# Patient Record
Sex: Male | Born: 1955 | Race: White | Hispanic: No | Smoking: Current every day smoker
Health system: Southern US, Community
[De-identification: ages and names within clinical notes are randomized; demographics above are authoritative.]

## PROBLEM LIST (undated history)

## (undated) DIAGNOSIS — M25552 Pain in left hip: Secondary | ICD-10-CM

## (undated) DIAGNOSIS — Z87442 Personal history of urinary calculi: Secondary | ICD-10-CM

## (undated) DIAGNOSIS — B880 Other acariasis: Secondary | ICD-10-CM

## (undated) DIAGNOSIS — T17308A Unspecified foreign body in larynx causing other injury, initial encounter: Secondary | ICD-10-CM

## (undated) DIAGNOSIS — J101 Influenza due to other identified influenza virus with other respiratory manifestations: Secondary | ICD-10-CM

## (undated) DIAGNOSIS — K4020 Bilateral inguinal hernia, without obstruction or gangrene, not specified as recurrent: Secondary | ICD-10-CM

## (undated) DIAGNOSIS — H6692 Otitis media, unspecified, left ear: Secondary | ICD-10-CM

## (undated) HISTORY — PX: KNEE ARTHROSCOPY: SHX127

---

## 2012-08-29 ENCOUNTER — Encounter (HOSPITAL_BASED_OUTPATIENT_CLINIC_OR_DEPARTMENT_OTHER): Payer: Self-pay | Admitting: *Deleted

## 2012-08-29 ENCOUNTER — Emergency Department (HOSPITAL_BASED_OUTPATIENT_CLINIC_OR_DEPARTMENT_OTHER)
Admission: EM | Admit: 2012-08-29 | Discharge: 2012-08-30 | Disposition: A | Discharge status: Home / Self Care | Payer: 59 | Attending: Emergency Medicine | Admitting: Emergency Medicine

## 2012-08-29 DIAGNOSIS — F17200 Nicotine dependence, unspecified, uncomplicated: Secondary | ICD-10-CM | POA: Insufficient documentation

## 2012-08-29 DIAGNOSIS — L089 Local infection of the skin and subcutaneous tissue, unspecified: Secondary | ICD-10-CM

## 2012-08-29 DIAGNOSIS — IMO0001 Reserved for inherently not codable concepts without codable children: Secondary | ICD-10-CM | POA: Insufficient documentation

## 2012-08-29 NOTE — ED Notes (Signed)
Pt c/o insect bite to lower ext x 1 day

## 2012-08-30 ENCOUNTER — Encounter (HOSPITAL_BASED_OUTPATIENT_CLINIC_OR_DEPARTMENT_OTHER): Payer: Self-pay | Admitting: Emergency Medicine

## 2012-08-30 MED ORDER — PREDNISONE 20 MG PO TABS: 40.0000 mg | ORAL_TABLET | Freq: Every day | ORAL | Status: AC

## 2012-08-30 MED ORDER — PREDNISONE 20 MG PO TABS
40.0000 mg | ORAL_TABLET | Freq: Once | ORAL | Status: AC
  Administered 2012-08-30: 40 mg via ORAL
  Filled 2012-08-30: qty 2

## 2012-08-30 MED ORDER — DIPHENHYDRAMINE HCL 25 MG PO CAPS
50.0000 mg | ORAL_CAPSULE | Freq: Once | ORAL | Status: AC
  Administered 2012-08-30: 50 mg via ORAL
  Filled 2012-08-30: qty 2

## 2012-08-30 NOTE — ED Notes (Signed)
MD at bedside. 

## 2012-08-30 NOTE — ED Provider Notes (Signed)
History     CSN: [id number]  Arrival date & time 08/29/12  2150   First MD Initiated Contact with Patient 08/30/12 0101      Chief Complaint  Patient presents with  . Insect Bite    (Consider location/radiation/quality/duration/timing/severity/associated sxs/prior treatment) HPI Comments: Pt reports going hunting earlier today and was bitten by multiple chiggers along both lower legs, into thighs and around groin.  Severe itching.  No drainage from wounds, no fevers, chills.  Denies SOB.  Pt was treated for similar in the past with steroids, requests same.  Pt reports has benadryl at home.  Has not taken anything for symptoms yet.    The history is provided by the patient and a relative.    History reviewed. No pertinent past medical history.  History reviewed. No pertinent past surgical history.  History reviewed. No pertinent family history.  History  Substance Use Topics  . Smoking status: Current Everyday Smoker -- 2.0 packs/day  . Smokeless tobacco: Not on file  . Alcohol Use: No      Review of Systems  Constitutional: Negative.   HENT: Negative for trouble swallowing.   Respiratory: Negative for shortness of breath.   Gastrointestinal: Negative for nausea and diarrhea.  Skin: Positive for rash.  Neurological: Negative for dizziness and light-headedness.    Allergies  Review of patient's allergies indicates no known allergies.  Home Medications   Current Outpatient Rx  Name Route Sig Dispense Refill  . PREDNISONE 20 MG PO TABS Oral Take 2 tablets (40 mg total) by mouth daily. 14 tablet 0    BP 110/68  Pulse 75  Temp 98.2 F (36.8 C)  Resp 16  Ht 5\' 9"  (1.753 m)  Wt 130 lb (58.968 kg)  BMI 19.20 kg/m2  SpO2 100%  Physical Exam  Nursing note and vitals reviewed. Constitutional: He is oriented to person, place, and time. He appears well-developed and well-nourished.  HENT:  Head: Normocephalic and atraumatic.  Pulmonary/Chest: Effort normal and  breath sounds normal. No stridor. No respiratory distress.  Neurological: He is alert and oriented to person, place, and time.  Skin: Skin is warm and dry. Rash noted. He is not diaphoretic. No pallor.       Multiple pruritic small papular lesions to lower legs, upper legs, more concentrated around ankles and lower legs.  Psychiatric: He has a normal mood and affect.    ED Course  Procedures (including critical care time)  Labs Reviewed - No data to display No results found.   1. Infected insect bites of multiple sites       MDM  Multiple insect bites, sig portion of body, will treat with systemic steroids, pt has used before.  Benadryl for itching.          Gavin Pound. Oletta Lamas, MD 08/30/12 [phone]

## 2013-03-20 ENCOUNTER — Emergency Department (HOSPITAL_BASED_OUTPATIENT_CLINIC_OR_DEPARTMENT_OTHER): Payer: 59

## 2013-03-20 ENCOUNTER — Encounter (HOSPITAL_BASED_OUTPATIENT_CLINIC_OR_DEPARTMENT_OTHER): Payer: Self-pay | Admitting: *Deleted

## 2013-03-20 ENCOUNTER — Emergency Department (HOSPITAL_BASED_OUTPATIENT_CLINIC_OR_DEPARTMENT_OTHER)
Admission: EM | Admit: 2013-03-20 | Discharge: 2013-03-20 | Disposition: A | Discharge status: Home / Self Care | Payer: 59 | Attending: Emergency Medicine | Admitting: Emergency Medicine

## 2013-03-20 DIAGNOSIS — N200 Calculus of kidney: Secondary | ICD-10-CM | POA: Insufficient documentation

## 2013-03-20 DIAGNOSIS — R110 Nausea: Secondary | ICD-10-CM | POA: Insufficient documentation

## 2013-03-20 DIAGNOSIS — F17200 Nicotine dependence, unspecified, uncomplicated: Secondary | ICD-10-CM | POA: Insufficient documentation

## 2013-03-20 LAB — CBC WITH DIFFERENTIAL/PLATELET
Eosinophils Absolute: 0.1 10*3/uL (ref 0.0–0.7)
Eosinophils Relative: 2 % (ref 0–5)
HCT: 43.1 % (ref 39.0–52.0)
Hemoglobin: 15 g/dL (ref 13.0–17.0)
Lymphs Abs: 2.3 10*3/uL (ref 0.7–4.0)
MCH: 31.3 pg (ref 26.0–34.0)
MCV: 90 fL (ref 78.0–100.0)
Monocytes Absolute: 1 10*3/uL (ref 0.1–1.0)
Monocytes Relative: 12 % (ref 3–12)
Platelets: 285 10*3/uL (ref 150–400)
RBC: 4.79 MIL/uL (ref 4.22–5.81)

## 2013-03-20 LAB — BASIC METABOLIC PANEL
BUN: 13 mg/dL (ref 6–23)
CO2: 25 mEq/L (ref 19–32)
Calcium: 9.1 mg/dL (ref 8.4–10.5)
Creatinine, Ser: 0.9 mg/dL (ref 0.50–1.35)
GFR calc non Af Amer: >90 mL/min (ref >90)
Glucose, Bld: 148 mg/dL — ABNORMAL HIGH (ref 70–99)

## 2013-03-20 LAB — URINALYSIS, ROUTINE W REFLEX MICROSCOPIC
Bilirubin Urine: NEGATIVE
Glucose, UA: NEGATIVE mg/dL
Specific Gravity, Urine: >1.046 — ABNORMAL HIGH (ref 1.005–1.030)
Urobilinogen, UA: 0.2 mg/dL (ref 0.0–1.0)

## 2013-03-20 MED ORDER — ONDANSETRON HCL 4 MG/2ML IJ SOLN
4.0000 mg | Freq: Once | INTRAMUSCULAR | Status: AC
  Administered 2013-03-20: 4 mg via INTRAVENOUS
  Filled 2013-03-20: qty 2

## 2013-03-20 MED ORDER — HYDROMORPHONE HCL PF 1 MG/ML IJ SOLN
1.0000 mg | Freq: Once | INTRAMUSCULAR | Status: AC
  Administered 2013-03-20: 1 mg via INTRAVENOUS
  Filled 2013-03-20: qty 1

## 2013-03-20 MED ORDER — IOHEXOL 300 MG/ML  SOLN
100.0000 mL | Freq: Once | INTRAMUSCULAR | Status: AC | PRN
  Administered 2013-03-20: 100 mL via INTRAVENOUS

## 2013-03-20 MED ORDER — SODIUM CHLORIDE 0.9 % IV BOLUS (SEPSIS)
1000.0000 mL | Freq: Once | INTRAVENOUS | Status: AC
  Administered 2013-03-20: 1000 mL via INTRAVENOUS

## 2013-03-20 MED ORDER — IOHEXOL 300 MG/ML  SOLN
50.0000 mL | Freq: Once | INTRAMUSCULAR | Status: AC | PRN
  Administered 2013-03-20: 50 mL via ORAL

## 2013-03-20 NOTE — ED Provider Notes (Signed)
History     CSN: [id number]  Arrival date & time 03/20/13  0935   First MD Initiated Contact with Patient 03/20/13 [phone]      Chief Complaint  Patient presents with  . Flank Pain    (Consider location/radiation/quality/duration/timing/severity/associated sxs/prior treatment) Patient is a 57 y.o. male presenting with flank pain.  Flank Pain   Pt with remote history of kidney stone reports he was at work this AM around 7am when he began having R flank pain radiating to RLQ and R groin with sharp pain at tip of penis for a few minutes. He has had constant severe aching pain since then, associated with nausea. No fever. No diarrhea. He has urinated several times since then.   Past Medical History  Diagnosis Date  . Kidney stone     History reviewed. No pertinent past surgical history.  No family history on file.  History  Substance Use Topics  . Smoking status: Current Every Day Smoker -- 2.00 packs/day  . Smokeless tobacco: Not on file  . Alcohol Use: No      Review of Systems  Genitourinary: Positive for flank pain.   All other systems reviewed and are negative except as noted in HPI.   Allergies  Other  Home Medications  No current outpatient prescriptions on file.  BP 116/54  Pulse 64  Temp(Src) 98.7 F (37.1 C) (Oral)  Resp 18  SpO2 99%  Physical Exam  Nursing note and vitals reviewed. Constitutional: He is oriented to person, place, and time. He appears well-developed and well-nourished.  HENT:  Head: Normocephalic and atraumatic.  Eyes: EOM are normal. Pupils are equal, round, and reactive to light.  Neck: Normal range of motion. Neck supple.  Cardiovascular: Normal rate, normal heart sounds and intact distal pulses.   Pulmonary/Chest: Effort normal and breath sounds normal.  Abdominal: Bowel sounds are normal. He exhibits no distension. There is tenderness (RLQ). There is guarding. There is no rebound.  Musculoskeletal: Normal range of motion. He  exhibits no edema and no tenderness.  Neurological: He is alert and oriented to person, place, and time. He has normal strength. No cranial nerve deficit or sensory deficit.  Skin: Skin is warm and dry. No rash noted.  Psychiatric: He has a normal mood and affect.    ED Course  Procedures (including critical care time)  Labs Reviewed  URINALYSIS, ROUTINE W REFLEX MICROSCOPIC - Abnormal; Notable for the following:    Specific Gravity, Urine >1.046 (*)    Hgb urine dipstick MODERATE (*)    All other components within normal limits  BASIC METABOLIC PANEL - Abnormal; Notable for the following:    Glucose, Bld 148 (*)    All other components within normal limits  CBC WITH DIFFERENTIAL  URINE MICROSCOPIC-ADD ON   Ct Abdomen Pelvis W Contrast  03/20/2013  *RADIOLOGY REPORT*  Clinical Data: Right flank pain radiating to right lower quadrant.  CT ABDOMEN AND PELVIS WITH CONTRAST  Technique:  Multidetector CT imaging of the abdomen and pelvis was performed following the standard protocol during bolus administration of intravenous contrast.  Contrast: 50mL OMNIPAQUE IOHEXOL 300 MG/ML  SOLN, OMNIPAQUE IOHEXOL 300 MG/ML  SOLN  Comparison: None.  Findings: 5 mm subpleural nodule in the right middle lobe, image 6 of series 4.  Dependent subsegmental atelectasis noted in the lower lobes. Scattered small fluid density lesions in the liver measure up to 8 mm and are statistically likely represent cysts or benign lesions, but technically nonspecific.  The spleen and gallbladder appear normal.  Dorsal pancreatic duct measures 3 mm in diameter, the dorsal pancreatic duct measures up to 3 mm in diameter, borderline prominent.  A 1.6 x 1.0 cm left adrenal mass has a relative washout of 60%, compatible with adenoma.  A 2 mm right kidney upper pole nonobstructive calculus is present. I suspect a 1 mm punctate right mid kidney calculus.  No hydronephrosis or hydroureter observed.  There is slight indistinctness  along the margins of the right collecting system. Also, a 2 mm calculus is present in the midline in the lumen of the urinary bladder.  No left-sided calculi are identified.  Small retroperitoneal lymph nodes are not pathologically enlarged by size criteria.  Mild aortoiliac atherosclerotic calcification is present.  No pathologic pelvic adenopathy is identified.  A small portion of the cecum extends into the right inguinal hernia.  Appendix observed along the right pelvic sidewall, without inflammation.  No dilated bowel noted.  There is mild prominence of stool in the colon.  No free pelvic fluid is observed.  IMPRESSION:  1.  2 mm calculus loose within the urinary bladder, has likely recently passed from the right ureter given the slight right sided stranding along the collecting system.  No current hydronephrosis or hydroureter. 2.  Nonobstructive right nephrolithiasis. 3.  Small portion of the cecum extends into the right inguinal hernia. 4.  Aortoiliac atherosclerotic calcification. 5.  Scattered hypodensities in the liver, potentially small cysts or bile duct hamartomas.  These are likely benign although technically too small to characterize. 5.  Dorsal pancreatic duct is at the upper limits of normal and sinus.  No pancreas divisum observed. 6.  Small left adrenal adenoma.   Original Report Authenticated By: Gaylyn Rong, M.D.      1. Kidney stone       MDM  Symptoms concerning for kidney stone, but he is guarding in RLQ, so will also check for appendicitis.   12:28 PM Pain improved. CT as above reviewed with the patient including incidental findings. He was advised to strain his urine, although he urinated here after CT was done and may have already passed the stone out of his bladder.      Mate Alegria B. Bernette Mayers, MD 03/20/13 1229

## 2013-03-20 NOTE — ED Notes (Signed)
Patient states he went to urinate this morning at 0700 and developed a sudden pain in his right flank area which is now radiating into groin.  Past hx of kidney stone > 15 years ago. Took otc Goodie powder with no relief.

## 2013-10-16 DIAGNOSIS — Z720 Tobacco use: Secondary | ICD-10-CM | POA: Diagnosis present

## 2018-02-19 DIAGNOSIS — H6692 Otitis media, unspecified, left ear: Secondary | ICD-10-CM

## 2018-02-19 DIAGNOSIS — J101 Influenza due to other identified influenza virus with other respiratory manifestations: Secondary | ICD-10-CM

## 2018-02-19 HISTORY — DX: Influenza due to other identified influenza virus with other respiratory manifestations: J10.1

## 2018-02-19 HISTORY — DX: Otitis media, unspecified, left ear: H66.92

## 2018-06-03 ENCOUNTER — Ambulatory Visit: Payer: Self-pay | Admitting: Surgery

## 2018-06-03 NOTE — H&P (Signed)
Sean Gordon Documented: 06/03/2018 10:30 AM Location: [facility] Patient #: [id number] DOB: [birth year] Widowed / Language: Undefined / Race: Refused to Report/Unreported Male  History of Present Illness Sean Hector MD; 06/03/2018 10:59 AM) The patient is a 62 year old male who presents with an inguinal hernia. Note for "Inguinal hernia": ` ` ` Patient sent for surgical consultation at the request of Dr. Kathie Gordon  Chief Complaint: Testicular groin pain. Right internal hernia.  The patient is a smoking male has had some testicular pain and saw Dr. Karsten Gordon with urology. Some nocturia and hesitancy. Started on outpatient samples. Diagnosised right inguinal hernia. He has a CAT scan from 2014 that showed a moderate size inguinal hernia with some of the cecum going down into it. Recalls being told he had a tear in his groin about 15 years ago. Has not really bothered him until about 9 months ago. No he's had chronic groin pain especially testicular pain. He does smoke about 2 packs a day. Can walk several miles without any difficulty. No history of heart attack or stroke. No abdominal surgery. No prior inguinal surgery. Not diabetic. Moves his bowels every day. Occasionally takes Goody powder for the groin discomfort but not needing narcotics. Takes Xanax to help sleep at night.  (Review of systems as stated in this history (HPI) or in the review of systems. Otherwise all other 12 point ROS are negative) ` ` ` Past medical history as noted on scant sheet. History of kidney stone.  No prior abdominal surgery.  Denies much alcohol use. Smokes 2 packs a day.   Allergies Sean Gordon, [place]; 06/03/2018 10:30 AM) No Known Drug Allergies [06/03/2018]: Allergies Reconciled  Medication History Sean Gordon, [place]; 06/03/2018 10:31 AM) Xanax (0.5MG  Tablet, Oral) Active. Medications Reconciled    Vitals Sean Gordon RMA; 06/03/2018 10:30  AM) 06/03/2018 10:30 AM Weight: 120.2 lb Height: 69in Body Surface Area: 1.66 m Body Mass Index: 17.75 kg/m  Temp.: 97.79F  Pulse: 73 (Regular)  BP: 120/78 (Sitting, Left Arm, Standard)      Physical Exam Sean Hector MD; 06/03/2018 10:56 AM)  General Mental Status-Alert. General Appearance-Not in acute distress, Not Sickly. Orientation-Oriented X3. Hydration-Well hydrated. Voice-Normal.  Integumentary Global Assessment Upon inspection and palpation of skin surfaces of the - Axillae: non-tender, no inflammation or ulceration, no drainage. and Distribution of scalp and body hair is normal. General Characteristics Temperature - normal warmth is noted.  Head and Neck Head-normocephalic, atraumatic with no lesions or palpable masses. Face Global Assessment - atraumatic, no absence of expression. Neck Global Assessment - no abnormal movements, no bruit auscultated on the right, no bruit auscultated on the left, no decreased range of motion, non-tender. Trachea-midline. Thyroid Gland Characteristics - non-tender.  Eye Eyeball - Left-Extraocular movements intact, No Nystagmus. Eyeball - Right-Extraocular movements intact, No Nystagmus. Cornea - Left-No Hazy. Cornea - Right-No Hazy. Sclera/Conjunctiva - Left-No scleral icterus, No Discharge. Sclera/Conjunctiva - Right-No scleral icterus, No Discharge. Pupil - Left-Direct reaction to light normal. Pupil - Right-Direct reaction to light normal.  ENMT Ears Pinna - Left - no drainage observed, no generalized tenderness observed. Right - no drainage observed, no generalized tenderness observed. Nose and Sinuses External Inspection of the Nose - no destructive lesion observed. Inspection of the nares - Left - quiet respiration. Right - quiet respiration. Mouth and Throat Lips - Upper Lip - no fissures observed, no pallor noted. Lower Lip - no fissures observed, no pallor noted.  Nasopharynx - no  discharge present. Oral Cavity/Oropharynx - Tongue - no dryness observed. Oral Mucosa - no cyanosis observed. Hypopharynx - no evidence of airway distress observed.  Chest and Lung Exam Inspection Movements - Normal and Symmetrical. Accessory muscles - No use of accessory muscles in breathing. Palpation Palpation of the chest reveals - Non-tender. Auscultation Breath sounds - Normal and Clear.  Cardiovascular Auscultation Rhythm - Regular. Murmurs & Other Heart Sounds - Auscultation of the heart reveals - No Murmurs and No Systolic Clicks.  Abdomen Inspection Inspection of the abdomen reveals - No Visible peristalsis and No Abnormal pulsations. Umbilicus - No Bleeding, No Urine drainage. Palpation/Percussion Palpation and Percussion of the abdomen reveal - Soft, Non Tender, No Rebound tenderness, No Rigidity (guarding) and No Cutaneous hyperesthesia. Note: Abdomen soft. Nontender. Not distended. No umbilical or incisional hernias. No guarding.  Male Genitourinary Sexual Maturity Tanner 5 - Adult hair pattern and Adult penile size and shape. Note: Obvious right groin bulging reducible. Left bulging with Valsalva. Consistent with bilateral inguinal hernias. No testicular masses but some sensitivity at both testicles. Mildly thickened spermatic cord left greater than right. Question of varicocele. No epididymitis. Phallus within normal limits.  Peripheral Vascular Upper Extremity Inspection - Left - No Cyanotic nailbeds, Not Ischemic. Right - No Cyanotic nailbeds, Not Ischemic.  Neurologic Neurologic evaluation reveals -normal attention span and ability to concentrate, able to name objects and repeat phrases. Appropriate fund of knowledge , normal sensation and normal coordination. Mental Status Affect - not angry, not paranoid. Cranial Nerves-Normal Bilaterally. Gait-Normal.  Neuropsychiatric Mental status exam performed with findings of-able to  articulate well with normal speech/language, rate, volume and coherence, thought content normal with ability to perform basic computations and apply abstract reasoning and no evidence of hallucinations, delusions, obsessions or homicidal/suicidal ideation.  Musculoskeletal Global Assessment Spine, Ribs and Pelvis - no instability, subluxation or laxity. Right Upper Extremity - no instability, subluxation or laxity.  Lymphatic Head & Neck  General Head & Neck Lymphatics: Bilateral - Description - No Localized lymphadenopathy. Axillary  General Axillary Region: Bilateral - Description - No Localized lymphadenopathy. Femoral & Inguinal  Generalized Femoral & Inguinal Lymphatics: Left - Description - No Localized lymphadenopathy. Right - Description - No Localized lymphadenopathy.    Assessment & Plan Sean Hector MD; 06/03/2018 10:55 AM)  BILATERAL INGUINAL HERNIA WITHOUT OBSTRUCTION OR GANGRENE, RECURRENCE NOT SPECIFIED (K40.20) Impression: Obvious right inguinal hernia with bulging on Valsalva had some sliding consistent with small left inguinal hernia as well. Increasing pain and discomfort and testicular discomfort.  I think he would benefit from surgical repair. Laparoscopic preperitoneal approach.  I did caution him that because he's had the hernia for long time with chronic groin pain, he can take several months for that to go down. It may not fully resolved with his smoking and chronic pain. However, I feel we have a good chance to get him to better place. He is interested in proceeding.  He's had some mild nocturia and urinary symptoms. Some relief with Mybertiq from his urologist. I recommend he continue that perioperatively.  I strongly recommended he quit smoking. Makes the likelihood of chronic nerve pain and hernia recurrence much less   PREOP - ING HERNIA - ENCOUNTER FOR PREOPERATIVE EXAMINATION FOR GENERAL SURGICAL PROCEDURE (Z01.818)  Current Plans You are being  scheduled for surgery- Our schedulers will call you.  You should hear from our office's scheduling department within 5 working days about the location, date, and time of surgery. We try to make  accommodations for patient's preferences in scheduling surgery, but sometimes the OR schedule or the surgeon's schedule prevents [place] from making those accommodations.  If you have not heard from our office [phone]) in 5 working days, call the office and ask for your surgeon's nurse.  If you have other questions about your diagnosis, plan, or surgery, call the office and ask for your surgeon's nurse.  Written instructions provided The anatomy & physiology of the abdominal wall and pelvic floor was discussed. The pathophysiology of hernias in the inguinal and pelvic region was discussed. Natural history risks such as progressive enlargement, pain, incarceration, and strangulation was discussed. Contributors to complications such as smoking, obesity, diabetes, prior surgery, etc were discussed.  I feel the risks of no intervention will lead to serious problems that outweigh the operative risks; therefore, I recommended surgery to reduce and repair the hernia. I explained laparoscopic techniques with possible need for an open approach. I noted usual use of mesh to patch and/or buttress hernia repair  Risks such as bleeding, infection, abscess, need for further treatment, heart attack, death, and other risks were discussed. I noted a good likelihood this will help address the problem. Goals of post-operative recovery were discussed as well. Possibility that this will not correct all symptoms was explained. I stressed the importance of low-impact activity, aggressive pain control, avoiding constipation, & not pushing through pain to minimize risk of post-operative chronic pain or injury. Possibility of reherniation was discussed. We will work to minimize complications.  An educational handout  further explaining the pathology & treatment options was given as well. Questions were answered. The patient expresses understanding & wishes to proceed with surgery.  Pt Education - Pamphlet Given - Laparoscopic Hernia Repair: discussed with patient and provided information. Pt Education - [facility] Pain Control (Tannen Vandezande) Pt Education - [facility] Hernia Post-Op HCI (Juliano Mceachin): discussed with patient and provided information. Pt Education - [facility] Mesh education: discussed with patient and provided information.  TOBACCO ABUSE (Z72.0)  Current Plans Pt Education - [facility] STOP SMOKING!  Sean Hector, MD, FACS, MASCRS Gastrointestinal and Minimally Invasive Surgery    [address], [place], [place] [address] [phone] Main / Paging [phone] Fax

## 2018-07-05 ENCOUNTER — Encounter (HOSPITAL_COMMUNITY): Payer: Self-pay

## 2018-07-05 NOTE — Patient Instructions (Addendum)
Your procedure is scheduled on: Friday, July 08, 2018   Surgery Time:  7:30AM-9:30AM   Report to [facility] Main  Entrance    Report to admitting at 5:30 AM   Call this number if you have problems the morning of surgery [phone]   Do not eat food or drink liquids :After Midnight.   Do NOT smoke after Midnight   Complete one Ensure drink the morning of surgery by 4:30AM the day of surgery.   Take these medicines the morning of surgery with A SIP OF WATER: None                               You may not have any metal on your body including jewelry, and body piercings             Do not wear lotions, powders, perfumes/cologne, or deodorant                           Men may shave face and neck.   Do not bring valuables to the hospital. [place].   Contacts, dentures or bridgework may not be worn into surgery.    Patients discharged the day of surgery will not be allowed to drive home.   Special Instructions: Bring a copy of your healthcare power of attorney and living will documents         the day of surgery if you haven't scanned them in before.              Please read over the following fact sheets you were given:  [facility] - Preparing for Surgery Before surgery, you can play an important role.  Because skin is not sterile, your skin needs to be as free of germs as possible.  You can reduce the number of germs on your skin by washing with CHG (chlorahexidine gluconate) soap before surgery.  CHG is an antiseptic cleaner which kills germs and bonds with the skin to continue killing germs even after washing. Please DO NOT use if you have an allergy to CHG or antibacterial soaps.  If your skin becomes reddened/irritated stop using the CHG and inform your nurse when you arrive at Short Stay. Do not shave (including legs and underarms) for at least 48 hours prior to the first CHG shower.  You may shave your  face/neck.  Please follow these instructions carefully:  1.  Shower with CHG Soap the night before surgery and the  morning of surgery.  2.  If you choose to wash your hair, wash your hair first as usual with your normal  shampoo.  3.  After you shampoo, rinse your hair and body thoroughly to remove the shampoo.                             4.  Use CHG as you would any other liquid soap.  You can apply chg directly to the skin and wash.  Gently with a scrungie or clean washcloth.  5.  Apply the CHG Soap to your body ONLY FROM THE NECK DOWN.   Do   not use on face/ open  Wound or open sores. Avoid contact with eyes, ears mouth and   genitals (private parts).                       Wash face,  Genitals (private parts) with your normal soap.             6.  Wash thoroughly, paying special attention to the area where your    surgery  will be performed.  7.  Thoroughly rinse your body with warm water from the neck down.  8.  DO NOT shower/wash with your normal soap after using and rinsing off the CHG Soap.                9.  Pat yourself dry with a clean towel.            10.  Wear clean pajamas.            11.  Place clean sheets on your bed the night of your first shower and do not  sleep with pets. Day of Surgery : Do not apply any lotions/deodorants the morning of surgery.  Please wear clean clothes to the hospital/surgery center.  FAILURE TO FOLLOW THESE INSTRUCTIONS MAY RESULT IN THE CANCELLATION OF YOUR SURGERY  PATIENT SIGNATURE_________________________________  NURSE SIGNATURE__________________________________  ________________________________________________________________________

## 2018-07-06 ENCOUNTER — Encounter (HOSPITAL_COMMUNITY): Payer: Self-pay

## 2018-07-06 ENCOUNTER — Encounter (HOSPITAL_COMMUNITY)
Admission: RE | Admit: 2018-07-06 | Discharge: 2018-07-06 | Disposition: A | Discharge status: Home / Self Care | Payer: 59 | Source: Ambulatory Visit | Attending: Optometrist | Admitting: Optometrist

## 2018-07-06 ENCOUNTER — Other Ambulatory Visit: Payer: Self-pay

## 2018-07-06 DIAGNOSIS — Z79899 Other long term (current) drug therapy: Secondary | ICD-10-CM | POA: Diagnosis not present

## 2018-07-06 DIAGNOSIS — F17200 Nicotine dependence, unspecified, uncomplicated: Secondary | ICD-10-CM | POA: Diagnosis not present

## 2018-07-06 DIAGNOSIS — K4020 Bilateral inguinal hernia, without obstruction or gangrene, not specified as recurrent: Secondary | ICD-10-CM | POA: Diagnosis not present

## 2018-07-06 DIAGNOSIS — D176 Benign lipomatous neoplasm of spermatic cord: Secondary | ICD-10-CM | POA: Diagnosis not present

## 2018-07-06 HISTORY — DX: Bilateral inguinal hernia, without obstruction or gangrene, not specified as recurrent: K40.20

## 2018-07-06 HISTORY — DX: Pain in left hip: M25.552

## 2018-07-06 HISTORY — DX: Personal history of urinary calculi: Z87.442

## 2018-07-06 HISTORY — DX: Other acariasis: B88.0

## 2018-07-06 HISTORY — DX: Unspecified foreign body in larynx causing other injury, initial encounter: T17.308A

## 2018-07-06 LAB — CBC
HCT: 43.8 % (ref 39.0–52.0)
Hemoglobin: 14.9 g/dL (ref 13.0–17.0)
MCH: 31.5 pg (ref 26.0–34.0)
MCHC: 34 g/dL (ref 30.0–36.0)
MCV: 92.6 fL (ref 78.0–100.0)
PLATELETS: 310 10*3/uL (ref 150–400)
RBC: 4.73 MIL/uL (ref 4.22–5.81)
RDW: 13.3 % (ref 11.5–15.5)
WBC: 10.1 10*3/uL (ref 4.0–10.5)

## 2018-07-08 ENCOUNTER — Encounter (HOSPITAL_COMMUNITY)
Admission: RE | Disposition: A | Discharge status: Home / Self Care | Payer: Self-pay | Source: Ambulatory Visit | Attending: Surgery

## 2018-07-08 ENCOUNTER — Encounter (HOSPITAL_COMMUNITY): Payer: Self-pay

## 2018-07-08 ENCOUNTER — Ambulatory Visit (HOSPITAL_COMMUNITY): Payer: 59 | Admitting: Certified Registered Nurse Anesthetist

## 2018-07-08 ENCOUNTER — Ambulatory Visit (HOSPITAL_COMMUNITY)
Admission: RE | Admit: 2018-07-08 | Discharge: 2018-07-08 | Disposition: A | Discharge status: Home / Self Care | Payer: 59 | Source: Ambulatory Visit | Attending: Surgery | Admitting: Surgery

## 2018-07-08 DIAGNOSIS — Z79899 Other long term (current) drug therapy: Secondary | ICD-10-CM | POA: Insufficient documentation

## 2018-07-08 DIAGNOSIS — K4190 Unilateral femoral hernia, without obstruction or gangrene, not specified as recurrent: Secondary | ICD-10-CM

## 2018-07-08 DIAGNOSIS — K4090 Unilateral inguinal hernia, without obstruction or gangrene, not specified as recurrent: Secondary | ICD-10-CM

## 2018-07-08 DIAGNOSIS — Z720 Tobacco use: Secondary | ICD-10-CM | POA: Diagnosis present

## 2018-07-08 DIAGNOSIS — D176 Benign lipomatous neoplasm of spermatic cord: Secondary | ICD-10-CM | POA: Insufficient documentation

## 2018-07-08 DIAGNOSIS — F17200 Nicotine dependence, unspecified, uncomplicated: Secondary | ICD-10-CM | POA: Insufficient documentation

## 2018-07-08 DIAGNOSIS — K4020 Bilateral inguinal hernia, without obstruction or gangrene, not specified as recurrent: Secondary | ICD-10-CM | POA: Diagnosis not present

## 2018-07-08 HISTORY — PX: INSERTION OF MESH: SHX5868

## 2018-07-08 HISTORY — DX: Otitis media, unspecified, left ear: H66.92

## 2018-07-08 HISTORY — PX: INGUINAL HERNIA REPAIR: SHX194

## 2018-07-08 HISTORY — DX: Influenza due to other identified influenza virus with other respiratory manifestations: J10.1

## 2018-07-08 SURGERY — REPAIR, HERNIA, INGUINAL, BILATERAL, LAPAROSCOPIC
Anesthesia: General | Site: Inguinal

## 2018-07-08 MED ORDER — LIDOCAINE 2% (20 MG/ML) 5 ML SYRINGE
INTRAMUSCULAR | Status: DC | PRN
  Administered 2018-07-08: 60 mg via INTRAVENOUS

## 2018-07-08 MED ORDER — ACETAMINOPHEN 650 MG RE SUPP
650.0000 mg | RECTAL | Status: DC | PRN
  Filled 2018-07-08: qty 1

## 2018-07-08 MED ORDER — OXYCODONE HCL 5 MG PO TABS
5.0000 mg | ORAL_TABLET | Freq: Once | ORAL | Status: DC | PRN
Start: 2018-07-08 — End: 2018-07-08

## 2018-07-08 MED ORDER — FENTANYL CITRATE (PF) 100 MCG/2ML IJ SOLN
INTRAMUSCULAR | Status: DC | PRN
  Administered 2018-07-08: 100 ug via INTRAVENOUS
  Administered 2018-07-08: 50 ug via INTRAVENOUS

## 2018-07-08 MED ORDER — KETOROLAC TROMETHAMINE 30 MG/ML IJ SOLN: 30.0000 mg | Freq: Once | INTRAMUSCULAR | Status: DC | PRN

## 2018-07-08 MED ORDER — KETAMINE HCL 10 MG/ML IJ SOLN
INTRAMUSCULAR | Status: DC | PRN
  Administered 2018-07-08: 30 mg via INTRAVENOUS

## 2018-07-08 MED ORDER — SUGAMMADEX SODIUM 200 MG/2ML IV SOLN
INTRAVENOUS | Status: DC | PRN
  Administered 2018-07-08: 125 mg via INTRAVENOUS

## 2018-07-08 MED ORDER — LIDOCAINE HCL 2 % IJ SOLN
INTRAMUSCULAR | Status: AC
  Filled 2018-07-08: qty 20

## 2018-07-08 MED ORDER — EPHEDRINE SULFATE-NACL 50-0.9 MG/10ML-% IV SOSY
PREFILLED_SYRINGE | INTRAVENOUS | Status: DC | PRN
  Administered 2018-07-08: 5 mg via INTRAVENOUS
  Administered 2018-07-08: 10 mg via INTRAVENOUS

## 2018-07-08 MED ORDER — KETAMINE HCL 10 MG/ML IJ SOLN
INTRAMUSCULAR | Status: AC
  Filled 2018-07-08: qty 1

## 2018-07-08 MED ORDER — DEXAMETHASONE SODIUM PHOSPHATE 10 MG/ML IJ SOLN
INTRAMUSCULAR | Status: DC | PRN
  Administered 2018-07-08: 7 mg via INTRAVENOUS

## 2018-07-08 MED ORDER — SODIUM CHLORIDE 0.9% FLUSH: 3.0000 mL | Freq: Two times a day (BID) | INTRAVENOUS | Status: DC

## 2018-07-08 MED ORDER — TRAMADOL HCL 50 MG PO TABS
50.0000 mg | ORAL_TABLET | Freq: Once | ORAL | Status: AC
Start: 2018-07-08 — End: 2018-07-08
  Administered 2018-07-08: 50 mg via ORAL

## 2018-07-08 MED ORDER — TRAMADOL HCL 50 MG PO TABS
ORAL_TABLET | ORAL | Status: AC
  Filled 2018-07-08: qty 1

## 2018-07-08 MED ORDER — CEFAZOLIN SODIUM-DEXTROSE 2-4 GM/100ML-% IV SOLN
2.0000 g | INTRAVENOUS | Status: AC
  Administered 2018-07-08: 2 g via INTRAVENOUS
  Filled 2018-07-08: qty 100

## 2018-07-08 MED ORDER — CELECOXIB 200 MG PO CAPS
200.0000 mg | ORAL_CAPSULE | ORAL | Status: AC
  Administered 2018-07-08: 200 mg via ORAL
  Filled 2018-07-08: qty 1

## 2018-07-08 MED ORDER — SUGAMMADEX SODIUM 200 MG/2ML IV SOLN
INTRAVENOUS | Status: AC
  Filled 2018-07-08: qty 2

## 2018-07-08 MED ORDER — ONDANSETRON HCL 4 MG/2ML IJ SOLN
INTRAMUSCULAR | Status: DC | PRN
  Administered 2018-07-08: 4 mg via INTRAVENOUS

## 2018-07-08 MED ORDER — FENTANYL CITRATE (PF) 100 MCG/2ML IJ SOLN: 25.0000 ug | INTRAMUSCULAR | Status: DC | PRN

## 2018-07-08 MED ORDER — ACETAMINOPHEN 325 MG PO TABS: 650.0000 mg | ORAL_TABLET | ORAL | Status: DC | PRN

## 2018-07-08 MED ORDER — TRAMADOL HCL 50 MG PO TABS: 50.0000 mg | ORAL_TABLET | Freq: Four times a day (QID) | ORAL | 0 refills | Status: AC | PRN

## 2018-07-08 MED ORDER — LACTATED RINGERS IV SOLN
INTRAVENOUS | Status: DC
  Administered 2018-07-08: 1000 mL via INTRAVENOUS

## 2018-07-08 MED ORDER — ACETAMINOPHEN 500 MG PO TABS
1000.0000 mg | ORAL_TABLET | ORAL | Status: AC
  Administered 2018-07-08: 1000 mg via ORAL
  Filled 2018-07-08: qty 2

## 2018-07-08 MED ORDER — SODIUM CHLORIDE 0.9% FLUSH: 3.0000 mL | INTRAVENOUS | Status: DC | PRN

## 2018-07-08 MED ORDER — ROCURONIUM BROMIDE 10 MG/ML (PF) SYRINGE
PREFILLED_SYRINGE | INTRAVENOUS | Status: AC
  Filled 2018-07-08: qty 10

## 2018-07-08 MED ORDER — FENTANYL CITRATE (PF) 100 MCG/2ML IJ SOLN
25.0000 ug | INTRAMUSCULAR | Status: DC | PRN
Start: 2018-07-08 — End: 2018-07-08
  Administered 2018-07-08: 50 ug via INTRAVENOUS

## 2018-07-08 MED ORDER — GABAPENTIN 300 MG PO CAPS: 300.0000 mg | ORAL_CAPSULE | Freq: Two times a day (BID) | ORAL | 1 refills | Status: AC

## 2018-07-08 MED ORDER — FENTANYL CITRATE (PF) 250 MCG/5ML IJ SOLN
INTRAMUSCULAR | Status: AC
  Filled 2018-07-08: qty 5

## 2018-07-08 MED ORDER — PROPOFOL 10 MG/ML IV BOLUS
INTRAVENOUS | Status: AC
  Filled 2018-07-08: qty 20

## 2018-07-08 MED ORDER — OXYCODONE HCL 5 MG PO TABS: 5.0000 mg | ORAL_TABLET | ORAL | Status: DC | PRN

## 2018-07-08 MED ORDER — OXYCODONE HCL 5 MG/5ML PO SOLN
5.0000 mg | Freq: Once | ORAL | Status: DC | PRN
  Filled 2018-07-08: qty 5

## 2018-07-08 MED ORDER — PROPOFOL 10 MG/ML IV BOLUS
INTRAVENOUS | Status: DC | PRN
  Administered 2018-07-08: 130 mg via INTRAVENOUS

## 2018-07-08 MED ORDER — DEXAMETHASONE SODIUM PHOSPHATE 10 MG/ML IJ SOLN
INTRAMUSCULAR | Status: AC
  Filled 2018-07-08: qty 1

## 2018-07-08 MED ORDER — LIDOCAINE 2% (20 MG/ML) 5 ML SYRINGE
INTRAMUSCULAR | Status: AC
  Filled 2018-07-08: qty 5

## 2018-07-08 MED ORDER — BUPIVACAINE-EPINEPHRINE 0.25% -1:200000 IJ SOLN
INTRAMUSCULAR | Status: DC | PRN
  Administered 2018-07-08: 60 mL

## 2018-07-08 MED ORDER — TRAMADOL HCL 50 MG PO TABS
ORAL_TABLET | ORAL | Status: DC
Start: 2018-07-08 — End: 2018-07-08
  Filled 2018-07-08: qty 1

## 2018-07-08 MED ORDER — BUPIVACAINE-EPINEPHRINE (PF) 0.25% -1:200000 IJ SOLN
INTRAMUSCULAR | Status: AC
  Filled 2018-07-08: qty 60

## 2018-07-08 MED ORDER — PROMETHAZINE HCL 25 MG/ML IJ SOLN: 6.2500 mg | INTRAMUSCULAR | Status: DC | PRN

## 2018-07-08 MED ORDER — FENTANYL CITRATE (PF) 100 MCG/2ML IJ SOLN
INTRAMUSCULAR | Status: AC
  Filled 2018-07-08: qty 2

## 2018-07-08 MED ORDER — CHLORHEXIDINE GLUCONATE CLOTH 2 % EX PADS: 6.0000 | MEDICATED_PAD | Freq: Once | CUTANEOUS | Status: DC

## 2018-07-08 MED ORDER — GABAPENTIN 300 MG PO CAPS
300.0000 mg | ORAL_CAPSULE | ORAL | Status: AC
  Administered 2018-07-08: 300 mg via ORAL
  Filled 2018-07-08: qty 1

## 2018-07-08 MED ORDER — ROCURONIUM BROMIDE 50 MG/5ML IV SOSY
PREFILLED_SYRINGE | INTRAVENOUS | Status: DC | PRN
  Administered 2018-07-08: 50 mg via INTRAVENOUS

## 2018-07-08 MED ORDER — 0.9 % SODIUM CHLORIDE (POUR BTL) OPTIME
TOPICAL | Status: DC | PRN
  Administered 2018-07-08: 1000 mL

## 2018-07-08 MED ORDER — TRAMADOL HCL 50 MG PO TABS
50.0000 mg | ORAL_TABLET | Freq: Once | ORAL | Status: AC
  Administered 2018-07-08: 50 mg via ORAL

## 2018-07-08 MED ORDER — LIDOCAINE 2% (20 MG/ML) 5 ML SYRINGE
INTRAMUSCULAR | Status: DC | PRN
  Administered 2018-07-08: 1.5 mg/kg/h via INTRAVENOUS

## 2018-07-08 MED ORDER — SODIUM CHLORIDE 0.9 % IV SOLN
250.0000 mL | INTRAVENOUS | Status: DC | PRN
Start: 2018-07-08 — End: 2018-07-08

## 2018-07-08 MED ORDER — ONDANSETRON HCL 4 MG/2ML IJ SOLN
INTRAMUSCULAR | Status: AC
  Filled 2018-07-08: qty 2

## 2018-07-08 SURGICAL SUPPLY — 34 items
CABLE HIGH FREQUENCY MONO STRZ (ELECTRODE) ×3 IMPLANT
CHLORAPREP W/TINT 26ML (MISCELLANEOUS) ×3 IMPLANT
COVER SURGICAL LIGHT HANDLE (MISCELLANEOUS) ×3 IMPLANT
DECANTER SPIKE VIAL GLASS SM (MISCELLANEOUS) ×3 IMPLANT
DEVICE SECURE STRAP 25 ABSORB (INSTRUMENTS) IMPLANT
DRAPE WARM FLUID 44X44 (DRAPE) ×3 IMPLANT
DRSG TEGADERM 2-3/8X2-3/4 SM (GAUZE/BANDAGES/DRESSINGS) ×6 IMPLANT
DRSG TEGADERM 4X4.75 (GAUZE/BANDAGES/DRESSINGS) ×3 IMPLANT
ELECT REM PT RETURN 15FT ADLT (MISCELLANEOUS) ×3 IMPLANT
GAUZE SPONGE 2X2 8PLY STRL LF (GAUZE/BANDAGES/DRESSINGS) ×2 IMPLANT
GLOVE ECLIPSE 8.0 STRL XLNG CF (GLOVE) ×3 IMPLANT
GLOVE INDICATOR 8.0 STRL GRN (GLOVE) ×3 IMPLANT
GOWN STRL REUS W/TWL XL LVL3 (GOWN DISPOSABLE) ×12 IMPLANT
IRRIG SUCT STRYKERFLOW 2 WTIP (MISCELLANEOUS)
IRRIGATION SUCT STRKRFLW 2 WTP (MISCELLANEOUS) IMPLANT
KIT BASIN OR (CUSTOM PROCEDURE TRAY) ×3 IMPLANT
MESH ULTRAPRO 6X6 15CM15CM (Mesh General) ×6 IMPLANT
NEEDLE INSUFFLATION 14GA 120MM (NEEDLE) IMPLANT
PAD POSITIONING PINK XL (MISCELLANEOUS) ×3 IMPLANT
SCISSORS LAP 5X35 DISP (ENDOMECHANICALS) ×3 IMPLANT
SLEEVE ADV FIXATION 5X100MM (TROCAR) ×3 IMPLANT
SPONGE GAUZE 2X2 STER 10/PKG (GAUZE/BANDAGES/DRESSINGS) ×1
SUT MNCRL AB 4-0 PS2 18 (SUTURE) ×3 IMPLANT
SUT PDS AB 1 CT1 27 (SUTURE) ×6 IMPLANT
SUT VIC AB 2-0 SH 27 (SUTURE)
SUT VIC AB 2-0 SH 27X BRD (SUTURE) IMPLANT
SUT VICRYL 0 UR6 27IN ABS (SUTURE) ×3 IMPLANT
TACKER 5MM HERNIA 3.5CML NAB (ENDOMECHANICALS) IMPLANT
TOWEL OR 17X26 10 PK STRL BLUE (TOWEL DISPOSABLE) ×3 IMPLANT
TOWEL OR NON WOVEN STRL DISP B (DISPOSABLE) ×3 IMPLANT
TRAY LAPAROSCOPIC (CUSTOM PROCEDURE TRAY) ×3 IMPLANT
TROCAR ADV FIXATION 5X100MM (TROCAR) ×3 IMPLANT
TROCAR XCEL BLUNT TIP 100MML (ENDOMECHANICALS) ×3 IMPLANT
TUBING INSUF HEATED (TUBING) ×3 IMPLANT

## 2018-07-08 NOTE — Discharge Instructions (Signed)
HERNIA REPAIR: POST OP INSTRUCTIONS ° °###################################################################### ° °EAT °Gradually transition to a high fiber diet with a fiber supplement over the next few weeks after discharge.  Start with a pureed / full liquid diet (see below) ° °WALK °Walk an hour a day.  Control your pain to do that.   ° °CONTROL PAIN °Control pain so that you can walk, sleep, tolerate sneezing/coughing, and go up/down stairs. ° °HAVE A BOWEL MOVEMENT DAILY °Keep your bowels regular to avoid problems.  OK to try a laxative to override constipation.  OK to use an antidairrheal to slow down diarrhea.  Call if not better after 2 tries ° °CALL IF YOU HAVE PROBLEMS/CONCERNS °Call if you are still struggling despite following these instructions. °Call if you have concerns not answered by these instructions ° °###################################################################### ° ° ° °1. DIET: Follow a light bland diet the first 24 hours after arrival home, such as soup, liquids, crackers, etc.  Be sure to include lots of fluids daily.  Ad[place] to a low fat / high fiber diet over the next few days after surgery.  Avoid fast food or heavy meals the first week as your are more likely to get nauseated.   ° °2. Take your usually prescribed home medications unless otherwise directed. ° °3. PAIN CONTROL: °a. Pain is best controlled by a usual combination of three different methods TOGETHER: °i. Ice/Heat °ii. Over the counter pain medication °iii. Prescription pain medication °b. Most patients will experience some swelling and bruising around the hernia(s) such as the bellybutton, groins, or old incisions.  Ice packs or heating pads (30-60 minutes up to 6 times a day) will help. Use ice for the first few days to help decrease swelling and bruising, then switch to heat to help relax tight/sore spots and speed recovery.  Some people prefer to use ice alone, heat alone, alternating between ice & heat.  Experiment  to what works for you.  Swelling and bruising can take several weeks to resolve.   °c. It is helpful to take an over-the-counter pain medication regularly for the first few weeks.  Choose one of the following that works best for you: °i. Naproxen (Aleve, etc)  Two 220mg tabs twice a day °ii. Ibuprofen (Advil, etc) Three 200mg tabs four times a day (every meal & bedtime) °iii. Acetaminophen (Tylenol, etc) 325-650mg four times a day (every meal & bedtime) °d. A  prescription for pain medication should be given to you upon discharge.  Take your pain medication as prescribed.  °i. If you are having problems/concerns with the prescription medicine (does not control pain, nausea, vomiting, rash, itching, etc), please call us [phone] to see if we need to switch you to a different pain medicine that will work better for you and/or control your side effect better. °ii. If you need a refill on your pain medication, please contact your pharmacy.  They will contact our office to request authorization. Prescriptions will not be filled after 5 pm or on week-ends. ° °4. Avoid getting constipated.  Between the surgery and the pain medications, it is common to experience some constipation.  Increasing fluid intake and taking a fiber supplement (such as Metamucil, Citrucel, FiberCon, MiraLax, etc) 1-2 times a day regularly will usually help prevent this problem from occurring.  A mild laxative (prune juice, Milk of Magnesia, MiraLax, etc) should be taken according to package directions if there are no bowel movements after 48 hours.   ° °5. Wash / shower every   day.  You may shower over the dressings as they are waterproof.    6. Remove your waterproof bandages, skin tapes, and other bandages 5 days after surgery. You may replace a dressing/Band-Aid to cover the incision for comfort if you wish. You may leave the incisions open to air.  You may replace a dressing/Band-Aid to cover an incision for comfort if you wish.   Continue to shower over incision(s) after the dressing is off.  7. ACTIVITIES as tolerated:   a. You may resume regular (light) daily activities beginning the next day--such as daily self-care, walking, climbing stairs--gradually increasing activities as tolerated.  Control your pain so that you can walk an hour a day.  If you can walk 30 minutes without difficulty, it is safe to try more intense activity such as jogging, treadmill, bicycling, low-impact aerobics, swimming, etc. b. Save the most intensive and strenuous activity for last such as sit-ups, heavy lifting, contact sports, etc  Refrain from any heavy lifting or straining until you are off narcotics for pain control.   c. DO NOT PUSH THROUGH PAIN.  Let pain be your guide: If it hurts to do something, don't do it.  Pain is your body warning you to avoid that activity for another week until the pain goes down. d. You may drive when you are no longer taking prescription pain medication, you can comfortably wear a seatbelt, and you can safely maneuver your car and apply brakes. e. Dennis Bast may have sexual intercourse when it is comfortable.   8. FOLLOW UP in our office a. Please call [facility] at [phone] to set up an appointment to see your surgeon in the office for a follow-up appointment approximately 2-3 weeks after your surgery. b. Make sure that you call for this appointment the day you arrive home to insure a convenient appointment time.  9.  If you have disability of FMLA / Family leave forms, please bring the forms to the office for processing.  (do not give to your surgeon).  WHEN TO CALL us [phone]: 1. Poor pain control 2. Reactions / problems with new medications (rash/itching, nausea, etc)  3. Fever over 101.5 F (38.5 C) 4. Inability to urinate 5. Nausea and/or vomiting 6. Worsening swelling or bruising 7. Continued bleeding from incision. 8. Increased pain, redness, or drainage from the incision   The clinic staff is  available to answer your questions during regular business hours (8:30am-5pm).  Please dont hesitate to call and ask to speak to one of our nurses for clinical concerns.   If you have a medical emergency, go to the nearest emergency room or call 911.  A surgeon from [facility] is always on call at the hospitals in [facility], [place], [place], [place], [place]  [address] ?  [address] [address], [place], [place]   [address] MAIN: [phone] ? TOLL FREE: [phone] ? FAX: [phone] [email or web address]  General Anesthesia, Adult, Care After These instructions provide you with information about caring for yourself after your procedure. Your health care provider may also give you more specific instructions. Your treatment has been planned according to current medical practices, but problems sometimes occur. Call your health care provider if you have any problems or questions after your procedure. What can I expect after the procedure? After the procedure, it is common to have:  Vomiting.  A sore throat.  Mental slowness.  It is common to feel:  Nauseous.  Cold or shivery.  Sleepy.  Tired.  Sore or achy, even in parts of your body where you did not have surgery.  Follow these instructions at home: For at least 24 hours after the procedure:  Do not: ? Participate in activities where you could fall or become injured. ? Drive. ? Use heavy machinery. ? Drink alcohol. ? Take sleeping pills or medicines that cause drowsiness. ? Make important decisions or sign legal documents. ? Take care of children on your own.  Rest. Eating and drinking  If you vomit, drink water, juice, or soup when you can drink without vomiting.  Drink enough fluid to keep your urine clear or pale yellow.  Make sure you have little or no nausea before eating solid foods.  Follow the diet recommended by your health care  provider. General instructions  Have a responsible adult stay with you until you are awake and alert.  Return to your normal activities as told by your health care provider. Ask your health care provider what activities are safe for you.  Take over-the-counter and prescription medicines only as told by your health care provider.  If you smoke, do not smoke without supervision.  Keep all follow-up visits as told by your health care provider. This is important. Contact a health care provider if:  You continue to have nausea or vomiting at home, and medicines are not helpful.  You cannot drink fluids or start eating again.  You cannot urinate after 8-12 hours.  You develop a skin rash.  You have fever.  You have increasing redness at the site of your procedure. Get help right away if:  You have difficulty breathing.  You have chest pain.  You have unexpected bleeding.  You feel that you are having a life-threatening or urgent problem. This information is not intended to replace advice given to you by your health care provider. Make sure you discuss any questions you have with your health care provider. Document Released: 03/15/2001 Document Revised: 05/11/2016 Document Reviewed: 11/21/2015 Elsevier Interactive Patient Education  [employer].

## 2018-07-08 NOTE — Anesthesia Preprocedure Evaluation (Signed)
Anesthesia Evaluation  Patient identified by MRN, date of birth, ID band Patient awake    Reviewed: Allergy & Precautions, NPO status , Patient's Chart, lab work & pertinent test results  Airway Mallampati: II  TM Distance: >3 FB Neck ROM: Full    Dental no notable dental hx.    Pulmonary Current Smoker,    Pulmonary exam normal breath sounds clear to auscultation       Cardiovascular negative cardio ROS Normal cardiovascular exam Rhythm:Regular Rate:Normal     Neuro/Psych negative neurological ROS  negative psych ROS   GI/Hepatic negative GI ROS, Neg liver ROS,   Endo/Other  negative endocrine ROS  Renal/GU negative Renal ROS  negative genitourinary   Musculoskeletal negative musculoskeletal ROS (+)   Abdominal   Peds negative pediatric ROS (+)  Hematology negative hematology ROS (+)   Anesthesia Other Findings   Reproductive/Obstetrics negative OB ROS                             Anesthesia Physical Anesthesia Plan  ASA: II  Anesthesia Plan: General   Post-op Pain Management:    Induction: Intravenous  PONV Risk Score and Plan: 1 and Ondansetron, Dexamethasone and Treatment may vary due to age or medical condition  Airway Management Planned: Oral ETT  Additional Equipment:   Intra-op Plan:   Post-operative Plan: Extubation in OR  Informed Consent: I have reviewed the patients History and Physical, chart, labs and discussed the procedure including the risks, benefits and alternatives for the proposed anesthesia with the patient or authorized representative who has indicated his/her understanding and acceptance.   Dental advisory given  Plan Discussed with: CRNA and Surgeon  Anesthesia Plan Comments:         Anesthesia Quick Evaluation

## 2018-07-08 NOTE — OR Nursing (Signed)
Patient was discharged at 1420.  Patient's chart is in "read-only mode" so I'm unable to chart events on chart.  Patient's recovery care complete and procedure care complete time is at 1420.  Thanks, Mia Creek

## 2018-07-08 NOTE — H&P (Signed)
Sean Gordon DOB: 03-Aug-1956  Patient Care Team: Patient, No Pcp Per as PCP - General (General Practice) Michael Boston, MD as Consulting Physician (General Surgery) Kathie Rhodes, MD as Consulting Physician (Urology)   Patient sent for surgical consultation at the request of Dr. Kathie Rhodes  Chief Complaint: Testicular groin pain. Right inguinal hernia.  The patient is a smoking male has had some testicular pain and saw Dr. Karsten Ro with urology. Some nocturia and hesitancy. Started on outpatient samples. Diagnosised right inguinal hernia. He has a CAT scan from 2014 that showed a moderate size inguinal hernia with some of the cecum going down into it. Recalls being told he had a tear in his groin about 15 years ago. Has not really bothered him until about 9 months ago. No he's had chronic groin pain especially testicular pain. He does smoke about 2 packs a day. Can walk several miles without any difficulty. No history of heart attack or stroke. No abdominal surgery. No prior inguinal surgery. Not diabetic. Moves his bowels every day. Occasionally takes Goody powder for the groin discomfort but not needing narcotics. Takes Xanax to help sleep at night.  (Review of systems as stated in this history (HPI) or in the review of systems. Otherwise all other 12 point ROS are negative) ` ` ` Past medical history as noted on scant sheet. History of kidney stone.  No prior abdominal surgery.  Denies much alcohol use. Smokes 2 packs a day.   Allergies Alean Rinne, [place]; 06/03/2018 10:30 AM) No Known Drug Allergies [06/03/2018]: Allergies Reconciled  Medication History Alean Rinne, [place]; 06/03/2018 10:31 AM) Xanax (0.5MG  Tablet, Oral) Active. Medications Reconciled    Vitals Mardene Celeste King RMA; 06/03/2018 10:30 AM) 06/03/2018 10:30 AM Weight: 120.2 lb Height: 69in Body Surface Area: 1.66 m Body Mass Index: 17.75 kg/m  Temp.: 97.32F   Pulse: 73 (Regular)  BP: 120/78 (Sitting, Left Arm, Standard)  BP 119/70   Pulse 63   Temp 97.7 F (36.5 C) (Oral)   Resp 18   Ht 5\' 9"  (1.753 m)   Wt 54.9 kg (121 lb)   SpO2 100%   BMI 17.87 kg/m      Physical Exam Adin Hector MD; 06/03/2018 10:56 AM)  General Mental Status-Alert. General Appearance-Not in acute distress, Not Sickly. Orientation-Oriented X3. Hydration-Well hydrated. Voice-Normal.  Integumentary Global Assessment Upon inspection and palpation of skin surfaces of the - Axillae: non-tender, no inflammation or ulceration, no drainage. and Distribution of scalp and body hair is normal. General Characteristics Temperature - normal warmth is noted.  Head and Neck Head-normocephalic, atraumatic with no lesions or palpable masses. Face Global Assessment - atraumatic, no absence of expression. Neck Global Assessment - no abnormal movements, no bruit auscultated on the right, no bruit auscultated on the left, no decreased range of motion, non-tender. Trachea-midline. Thyroid Gland Characteristics - non-tender.  Eye Eyeball - Left-Extraocular movements intact, No Nystagmus. Eyeball - Right-Extraocular movements intact, No Nystagmus. Cornea - Left-No Hazy. Cornea - Right-No Hazy. Sclera/Conjunctiva - Left-No scleral icterus, No Discharge. Sclera/Conjunctiva - Right-No scleral icterus, No Discharge. Pupil - Left-Direct reaction to light normal. Pupil - Right-Direct reaction to light normal.  ENMT Ears Pinna - Left - no drainage observed, no generalized tenderness observed. Right - no drainage observed, no generalized tenderness observed. Nose and Sinuses External Inspection of the Nose - no destructive lesion observed. Inspection of the nares - Left - quiet respiration. Right - quiet respiration. Mouth and Throat Lips - Upper Lip -  no fissures observed, no pallor noted. Lower Lip - no fissures observed, no  pallor noted. Nasopharynx - no discharge present. Oral Cavity/Oropharynx - Tongue - no dryness observed. Oral Mucosa - no cyanosis observed. Hypopharynx - no evidence of airway distress observed.  Chest and Lung Exam Inspection Movements - Normal and Symmetrical. Accessory muscles - No use of accessory muscles in breathing. Palpation Palpation of the chest reveals - Non-tender. Auscultation Breath sounds - Normal and Clear.  Cardiovascular Auscultation Rhythm - Regular. Murmurs & Other Heart Sounds - Auscultation of the heart reveals - No Murmurs and No Systolic Clicks.  Abdomen Inspection Inspection of the abdomen reveals - No Visible peristalsis and No Abnormal pulsations. Umbilicus - No Bleeding, No Urine drainage. Palpation/Percussion Palpation and Percussion of the abdomen reveal - Soft, Non Tender, No Rebound tenderness, No Rigidity (guarding) and No Cutaneous hyperesthesia. Note: Abdomen soft. Nontender. Not distended. No umbilical or incisional hernias. No guarding.  Male Genitourinary Sexual Maturity Tanner 5 - Adult hair pattern and Adult penile size and shape. Note: Obvious right groin bulging reducible. Left bulging with Valsalva. Consistent with bilateral inguinal hernias. No testicular masses but some sensitivity at both testicles. Mildly thickened spermatic cord left greater than right. Question of varicocele. No epididymitis. Phallus within normal limits.  Peripheral Vascular Upper Extremity Inspection - Left - No Cyanotic nailbeds, Not Ischemic. Right - No Cyanotic nailbeds, Not Ischemic.  Neurologic Neurologic evaluation reveals -normal attention span and ability to concentrate, able to name objects and repeat phrases. Appropriate fund of knowledge , normal sensation and normal coordination. Mental Status Affect - not angry, not paranoid. Cranial Nerves-Normal Bilaterally. Gait-Normal.  Neuropsychiatric Mental status exam performed with  findings of-able to articulate well with normal speech/language, rate, volume and coherence, thought content normal with ability to perform basic computations and apply abstract reasoning and no evidence of hallucinations, delusions, obsessions or homicidal/suicidal ideation.  Musculoskeletal Global Assessment Spine, Ribs and Pelvis - no instability, subluxation or laxity. Right Upper Extremity - no instability, subluxation or laxity.  Lymphatic Head & Neck  General Head & Neck Lymphatics: Bilateral - Description - No Localized lymphadenopathy. Axillary  General Axillary Region: Bilateral - Description - No Localized lymphadenopathy. Femoral & Inguinal  Generalized Femoral & Inguinal Lymphatics: Left - Description - No Localized lymphadenopathy. Right - Description - No Localized lymphadenopathy.    Assessment & Plan Adin Hector MD; 06/03/2018 10:55 AM)  BILATERAL INGUINAL HERNIA WITHOUT OBSTRUCTION OR GANGRENE, RECURRENCE NOT SPECIFIED (K40.20) Impression: Obvious right inguinal hernia with bulging on Valsalva had some sliding consistent with small left inguinal hernia as well. Increasing pain and discomfort and testicular discomfort.  I think he would benefit from surgical repair. Laparoscopic preperitoneal approach.  The anatomy & physiology of the abdominal wall and pelvic floor was discussed. The pathophysiology of hernias in the inguinal and pelvic region was discussed. Natural history risks such as progressive enlargement, pain, incarceration, and strangulation was discussed. Contributors to complications such as smoking, obesity, diabetes, prior surgery, etc were discussed.  I feel the risks of no intervention will lead to serious problems that outweigh the operative risks; therefore, I recommended surgery to reduce and repair the hernia. I explained laparoscopic techniques with possible need for an open approach. I noted usual use of mesh to patch and/or  buttress hernia repair  Risks such as bleeding, infection, abscess, need for further treatment, heart attack, death, and other risks were discussed. I noted a good likelihood this will help address the problem. Goals of  post-operative recovery were discussed as well. Possibility that this will not correct all symptoms was explained. I stressed the importance of low-impact activity, aggressive pain control, avoiding constipation, & not pushing through pain to minimize risk of post-operative chronic pain or injury. Possibility of reherniation was discussed. We will work to minimize complications.  An educational handout further explaining the pathology & treatment options was given as well. Questions were answered. The patient expresses understanding & wishes to proceed with surgery.    Adin Hector, MD, FACS, MASCRS Gastrointestinal and Minimally Invasive Surgery    [address] [place], [place] [address] [phone] Main / Paging [phone] Fax

## 2018-07-08 NOTE — Anesthesia Procedure Notes (Signed)
Procedure Name: Intubation Date/Time: 07/08/2018 7:47 AM Performed by: Montel Clock, CRNA Pre-anesthesia Checklist: Patient identified, Emergency Drugs available, Suction available, Patient being monitored and Timeout performed Patient Re-evaluated:Patient Re-evaluated prior to induction Oxygen Delivery Method: Circle system utilized Preoxygenation: Pre-oxygenation with 100% oxygen Induction Type: IV induction Ventilation: Mask ventilation without difficulty, Oral airway inserted - appropriate to patient size and Two handed mask ventilation required Laryngoscope Size: Mac and 3 Grade View: Grade I Tube type: Oral Tube size: 7.5 mm Number of attempts: 1 Airway Equipment and Method: Stylet Placement Confirmation: ETT inserted through vocal cords under direct vision,  positive ETCO2 and breath sounds checked- equal and bilateral Secured at: 23 cm Tube secured with: Tape Dental Injury: Teeth and Oropharynx as per pre-operative assessment  Comments: 2 hand mask to hold cheek to mask, easy ventilation with 9 airway.

## 2018-07-08 NOTE — Op Note (Signed)
07/08/2018  8:48 AM  PATIENT:  Sean Gordon  62 y.o. male  Patient Care Team: Patient, No Pcp Per as PCP - General (General Practice) Michael Boston, MD as Consulting Physician (General Surgery) Kathie Rhodes, MD as Consulting Physician (Urology)  PRE-OPERATIVE DIAGNOSIS:  BILATERAL INGUINAL HERNIA  POST-OPERATIVE DIAGNOSIS:   RIGHT INGUINAL HERNIA LEFT FEMORAL HERNIA   PROCEDURE:   LAPAROSCOPIC RIGHT INGUINAL HERNIA REPAIR LAPAROSCOPIC LEFT FEMORAL HERNIA REPAIR  INSERTION OF MESH  SURGEON:  Adin Hector, MD  ASSISTANT: None  ANESTHESIA:     Regional ilioinguinal and genitofemoral and spermatic cord nerve blocks  General  EBL:  No intake/output data recorded..  <28mL See anesthesia record  Delay start of Pharmacological VTE agent (>24hrs) due to surgical blood loss or risk of bleeding:  no  DRAINS: NONE  SPECIMEN:  NONE  DISPOSITION OF SPECIMEN:  N/A  COUNTS:  YES  PLAN OF CARE: Discharge to home after PACU  PATIENT DISPOSITION:  PACU - hemodynamically stable.  INDICATION: Pleasant gentleman with inguinal hernia going down the right scrotum.  Seen by urology and diagnosed with this.  Surgical consultation recommended.  Impulse and some discomfort in the left side suspicious for contralateral hernia as well.  I recommended laparoscopic exploration and repair of hernias found.  The anatomy & physiology of the abdominal wall and pelvic floor was discussed.  The pathophysiology of hernias in the inguinal and pelvic region was discussed.  Natural history risks such as progressive enlargement, pain, incarceration & strangulation was discussed.   Contributors to complications such as smoking, obesity, diabetes, prior surgery, etc were discussed.    I feel the risks of no intervention will lead to serious problems that outweigh the operative risks; therefore, I recommended surgery to reduce and repair the hernia.  I explained laparoscopic techniques with possible need  for an open approach.  I noted usual use of mesh to patch and/or buttress hernia repair  Risks such as bleeding, infection, abscess, need for further treatment, heart attack, death, and other risks were discussed.  I noted a good likelihood this will help address the problem.   Goals of post-operative recovery were discussed as well.  Possibility that this will not correct all symptoms was explained.  I stressed the importance of low-impact activity, aggressive pain control, avoiding constipation, & not pushing through pain to minimize risk of post-operative chronic pain or injury. Possibility of reherniation was discussed.  We will work to minimize complications.     An educational handout further explaining the pathology & treatment options was given as well.  Questions were answered.  The patient expresses understanding & wishes to proceed with surgery.  OR FINDINGS: Obvious direct space hernia on the right side.  No indirect or obturator hernia.  Mild laxity at femoral canal but no definite hernia.  On the left side, small but definite femoral hernia.  Mild laxity in internal ring but not a strong evidence of an indirect inguinal hernia.  No direct space hernia.  No obturator hernia.  DESCRIPTION:  The patient was identified & brought into the operating room. The patient was positioned supine with arms tucked. SCDs were active during the entire case. The patient underwent general anesthesia without any difficulty.  The abdomen was prepped and draped in a sterile fashion. The patient's bladder was emptied.  A Surgical Timeout confirmed our plan.  I made a transverse incision through the inferior umbilical fold.  I made a small transverse nick through the anterior rectus  fascia contralateral to the inguinal hernia side and placed a 0-vicryl stitch through the fascia.  I placed a Hasson trocar into the preperitoneal plane.  Entry was clean.  We induced carbon dioxide insufflation. Camera inspection  revealed no injury.  I used a 30mm angled scope to bluntly free the peritoneum off the infraumbilical anterior abdominal wall.  I created enough of a preperitoneal pocket to place 61mm ports into the right & left mid-abdomen into this preperitoneal cavity.  I focused attention on the RIGHT pelvis since that was the dominant hernia side.   I used blunt & focused sharp dissection to free the peritoneum off the flank and down to the pubic rim.  I freed the anteriolateral bladder wall off the anteriolateral pelvic wall, sparing midline attachments.   I located a swath of peritoneum going into a hernia fascial defect at the  direct space consistent with  a direct space inguinal hernia..  I gradually freed the peritoneal hernia sac off safely and reduced it into the preperitoneal space.  I freed the peritoneum off the spermatic vessels & vas deferens.  I freed peritoneum off the retroperitoneum along the psoas muscle.  Spermatic cord lipoma was dissected away & removed.  I checked & assured hemostasis.     I turned attention on the opposite  LEFT pelvis.  I did dissection in a similar, mirror-image fashion. The patient had a femoral hernia.Marland Kitchen   Spermatic cord lipoma was dissected away & removed.    I checked & assured hemostasis.     I chose 15x15 cm sheets of ultra-lightweight polypropylene mesh (Ultrapro), one for each side.  I cut a single sigmoid-shaped slit ~6cm from a corner of each mesh.  I placed the meshes into the preperitoneal space & laid them as overlapping diamonds such that at the inferior points, a 6x6 cm corner flap rested in the true anterolateral pelvis, covering the obturator & femoral foramina.   I allowed the bladder to return to the pubis, this helping tuck the corners of the mesh in the anteriolateral pelvis.  The medial corners overlapped each other across midline cephalad to the pubic rim.   This provided >2 inch coverage around the hernias. Because the defects well covered and not  particularly large, I did not place any tacks.   I held the hernia sacs cephalad & evacuated carbon dioxide.  I closed the fascia with absorbable suture.  I closed the skin using 4-0 monocryl stitch.  Sterile dressings were applied.   The patient was extubated & arrived in the PACU in stable condition..  I had discussed postoperative care with the patient in the holding area.  Instructions are written in the chart.  I discussed operative findings, updated the patient's status, discussed probable steps to recovery, and gave postoperative recommendations to the patient's spouse.  Recommendations were made.  Questions were answered.  She expressed understanding & appreciation.   Adin Hector, M.D., F.A.C.S. Gastrointestinal and Minimally Invasive Surgery [facility] [address], [place], [place] [address] [phone] Main / Paging  07/08/2018 8:48 AM

## 2018-07-08 NOTE — Transfer of Care (Signed)
Immediate Anesthesia Transfer of Care Note  Patient: Sean Gordon  Procedure(s) Performed: LAPAROSCOPIC RIGHT INGUINAL AND LEFT FEMORAL HERNIA REPAIR WITH MESH (N/A Groin) INSERTION OF MESH (Bilateral Inguinal)  Patient Location: PACU  Anesthesia Type:General  Level of Consciousness: drowsy  Airway & Oxygen Therapy: Patient Spontanous Breathing and Patient connected to face mask oxygen  Post-op Assessment: Report given to RN and Post -op Vital signs reviewed and stable  Post vital signs: Reviewed and stable  Last Vitals:  Vitals Value Taken Time  BP 131/61 07/08/2018  8:56 AM  Temp    Pulse 74 07/08/2018  8:58 AM  Resp 20 07/08/2018  8:58 AM  SpO2 100 % 07/08/2018  8:58 AM  Vitals shown include unvalidated device data.  Last Pain:  Vitals:   07/08/18 0600  TempSrc:   PainSc: 0-No pain      Patients Stated Pain Goal: 5 (72/90/21 1155)  Complications: No apparent anesthesia complications

## 2018-07-11 NOTE — Anesthesia Postprocedure Evaluation (Signed)
Anesthesia Post Note  Patient: Sean Gordon  Procedure(s) Performed: LAPAROSCOPIC RIGHT INGUINAL AND LEFT FEMORAL HERNIA REPAIR WITH MESH (N/A Groin) INSERTION OF MESH (Bilateral Inguinal)     Patient location during evaluation: PACU Anesthesia Type: General Level of consciousness: awake and alert Pain management: pain level controlled Vital Signs Assessment: post-procedure vital signs reviewed and stable Respiratory status: spontaneous breathing, nonlabored ventilation, respiratory function stable and patient connected to nasal cannula oxygen Cardiovascular status: blood pressure returned to baseline and stable Postop Assessment: no apparent nausea or vomiting Anesthetic complications: no    Last Vitals:  Vitals:   07/08/18 0945 07/08/18 1415  BP: 121/67 138/72  Pulse: 65 74  Resp: (!) 23 16  Temp: 36.4 C   SpO2: 96% 99%    Last Pain:  Vitals:   07/08/18 1415  TempSrc:   PainSc: 4                  Edman Lipsey S

## 2019-10-12 ENCOUNTER — Other Ambulatory Visit: Payer: Self-pay | Admitting: Gastroenterology

## 2019-10-12 DIAGNOSIS — R140 Abdominal distension (gaseous): Secondary | ICD-10-CM

## 2019-10-17 ENCOUNTER — Ambulatory Visit
Admission: RE | Admit: 2019-10-17 | Discharge: 2019-10-17 | Disposition: A | Discharge status: Home / Self Care | Payer: 59 | Source: Ambulatory Visit | Attending: Gastroenterology | Admitting: Gastroenterology

## 2019-10-17 DIAGNOSIS — R140 Abdominal distension (gaseous): Secondary | ICD-10-CM

## 2020-02-02 ENCOUNTER — Other Ambulatory Visit: Payer: Self-pay | Admitting: Gastroenterology

## 2020-02-02 DIAGNOSIS — R1310 Dysphagia, unspecified: Secondary | ICD-10-CM

## 2020-02-02 DIAGNOSIS — R1319 Other dysphagia: Secondary | ICD-10-CM

## 2020-02-28 ENCOUNTER — Other Ambulatory Visit: Payer: Self-pay | Admitting: Gastroenterology

## 2020-02-28 DIAGNOSIS — R1084 Generalized abdominal pain: Secondary | ICD-10-CM

## 2020-03-08 ENCOUNTER — Ambulatory Visit
Admission: RE | Admit: 2020-03-08 | Discharge: 2020-03-08 | Disposition: A | Discharge status: Home / Self Care | Payer: 59 | Source: Ambulatory Visit | Attending: Gastroenterology | Admitting: Gastroenterology

## 2020-03-08 DIAGNOSIS — R1084 Generalized abdominal pain: Secondary | ICD-10-CM

## 2020-03-08 MED ORDER — IOPAMIDOL (ISOVUE-300) INJECTION 61%
100.0000 mL | Freq: Once | INTRAVENOUS | Status: AC | PRN
  Administered 2020-03-08: 11:00:00 100 mL via INTRAVENOUS

## 2020-12-03 ENCOUNTER — Other Ambulatory Visit (HOSPITAL_COMMUNITY): Payer: Self-pay | Admitting: Gastroenterology

## 2020-12-03 DIAGNOSIS — R1011 Right upper quadrant pain: Secondary | ICD-10-CM

## 2020-12-18 ENCOUNTER — Encounter (HOSPITAL_COMMUNITY)
Admission: RE | Admit: 2020-12-18 | Discharge: 2020-12-18 | Disposition: A | Discharge status: Home / Self Care | Payer: 59 | Source: Ambulatory Visit | Attending: Gastroenterology | Admitting: Gastroenterology

## 2020-12-18 ENCOUNTER — Other Ambulatory Visit: Payer: Self-pay

## 2020-12-18 DIAGNOSIS — R1011 Right upper quadrant pain: Secondary | ICD-10-CM | POA: Diagnosis present

## 2020-12-18 MED ORDER — TECHNETIUM TC 99M MEBROFENIN IV KIT
5.1000 | PACK | Freq: Once | INTRAVENOUS | Status: AC | PRN
  Administered 2020-12-18: 5.1 via INTRAVENOUS

## 2021-02-06 ENCOUNTER — Other Ambulatory Visit: Payer: Self-pay | Admitting: Gastroenterology

## 2021-02-07 ENCOUNTER — Other Ambulatory Visit: Payer: Self-pay | Admitting: Gastroenterology

## 2021-02-07 DIAGNOSIS — R1033 Periumbilical pain: Secondary | ICD-10-CM

## 2021-02-14 ENCOUNTER — Other Ambulatory Visit: Payer: Self-pay | Admitting: Gastroenterology

## 2021-02-14 ENCOUNTER — Ambulatory Visit
Admission: RE | Admit: 2021-02-14 | Discharge: 2021-02-14 | Disposition: A | Discharge status: Home / Self Care | Payer: 59 | Source: Ambulatory Visit | Attending: Gastroenterology | Admitting: Gastroenterology

## 2021-02-14 DIAGNOSIS — R1033 Periumbilical pain: Secondary | ICD-10-CM

## 2022-09-26 IMAGING — RF DG UGI W/ SMALL BOWEL
5 series · 14 of 24 positions shown · non-contrast
Comparison: None.

CLINICAL DATA: Periumbilical abdominal pain

EXAM:
UPPER GI SERIES WITH SMALL BOWEL FOLLOW-THROUGH
FLUOROSCOPY TIME:  Fluoroscopy Time:  1 minutes 48 seconds
Radiation Exposure Index (if provided by the fluoroscopic device):
Number of Acquired Spot Images: 5
TECHNIQUE: Combined double contrast and single contrast upper GI series using
effervescent crystals, thick barium, and thin barium. Subsequently,
serial images of the small bowel were obtained including spot views
of the terminal ileum.

[Series 1: one shot · 0.14mm/px · 1 of 1 slices shown (1 of 3)]
[im 1/1]
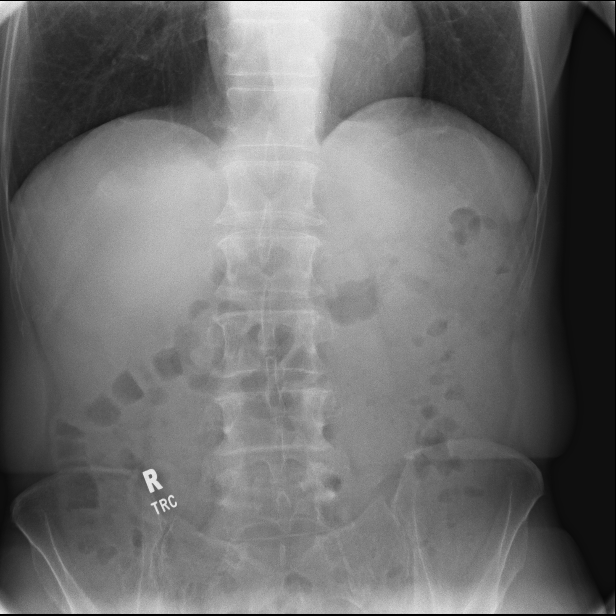

[Series 2: sequence · 0.29mm/px · 1 of 30 frames shown (1 of 2)]
[frame 9/30]
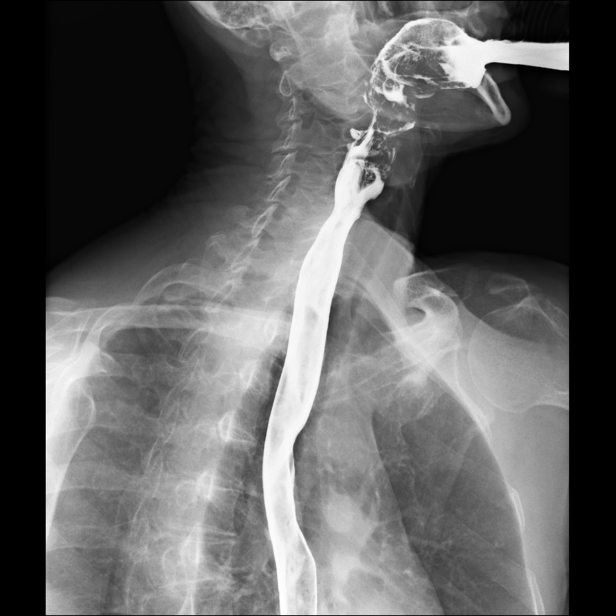

[Series 3: one shot · 0.15mm/px · 2 of 4 slices shown (2 of 3)]
[im 1/4]
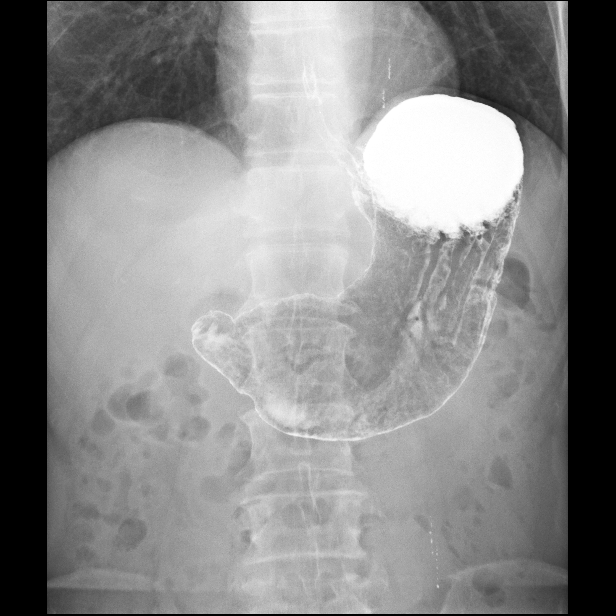
[im 3/4]
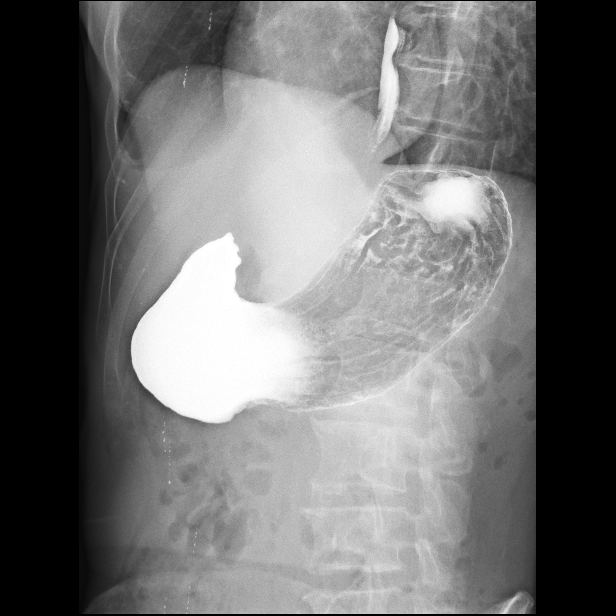

[Series 4: sequence · 0.29mm/px · 2 of 30 frames shown (2 of 2)]
[frame 5/30]
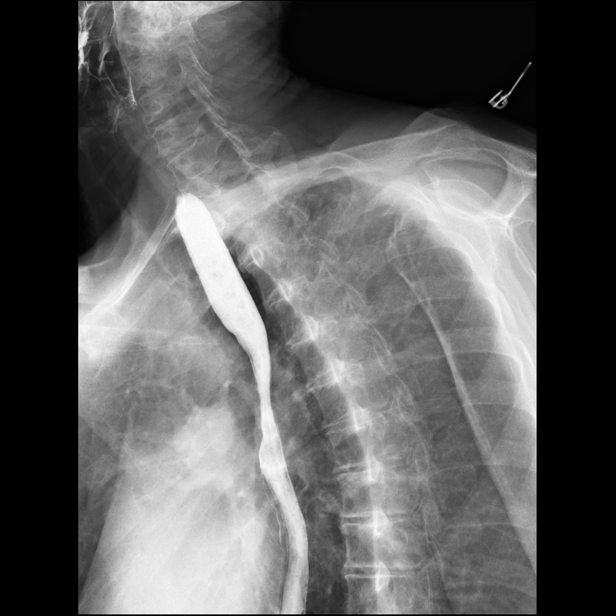
[frame 16/30]
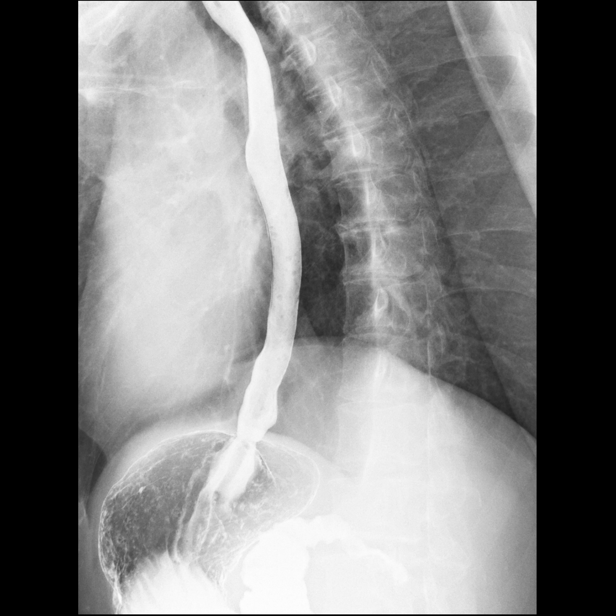

[Series 5: one shot · 0.15mm/px · 8 of 16 slices shown (3 of 3)]
[im 1/16]
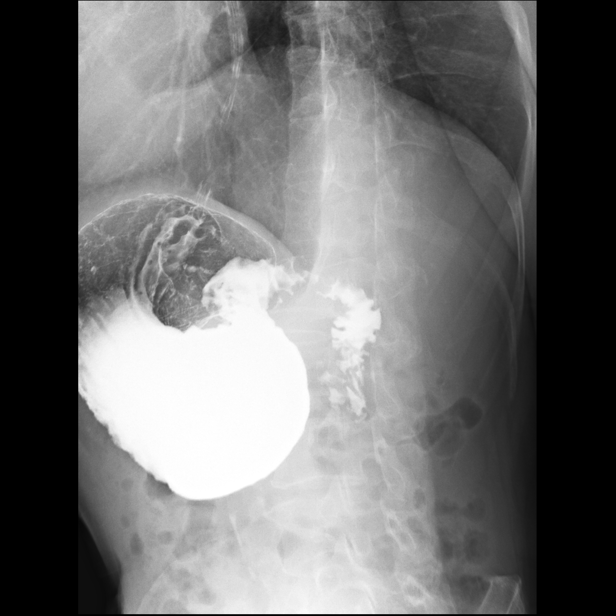
[im 3/16]
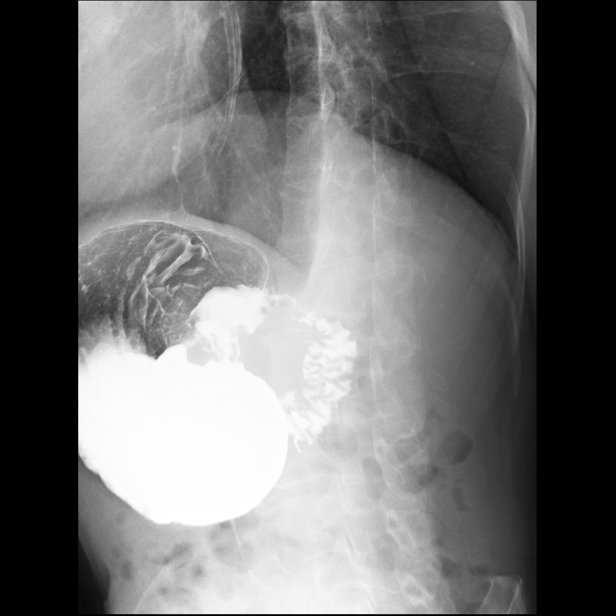
[im 5/16]
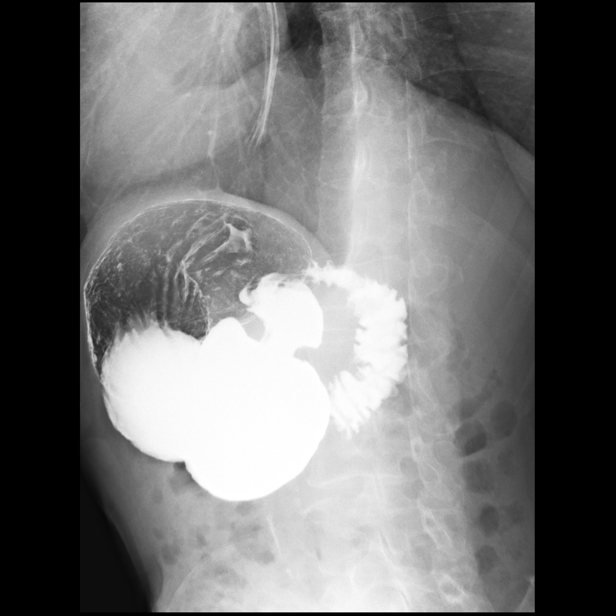
[im 7/16]
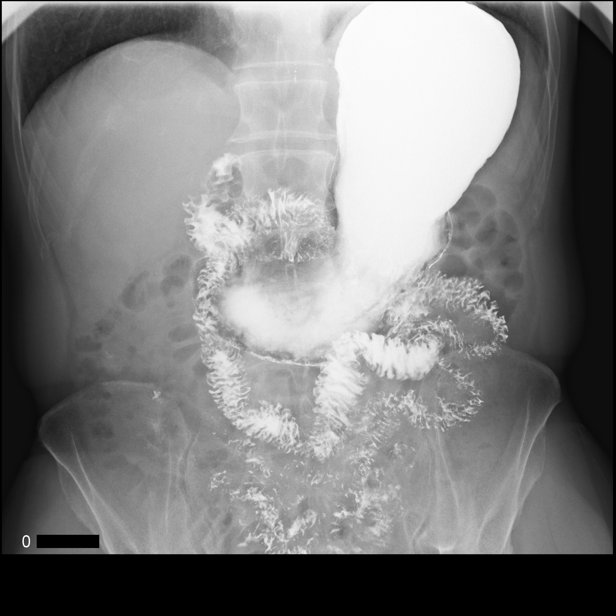
[im 10/16]
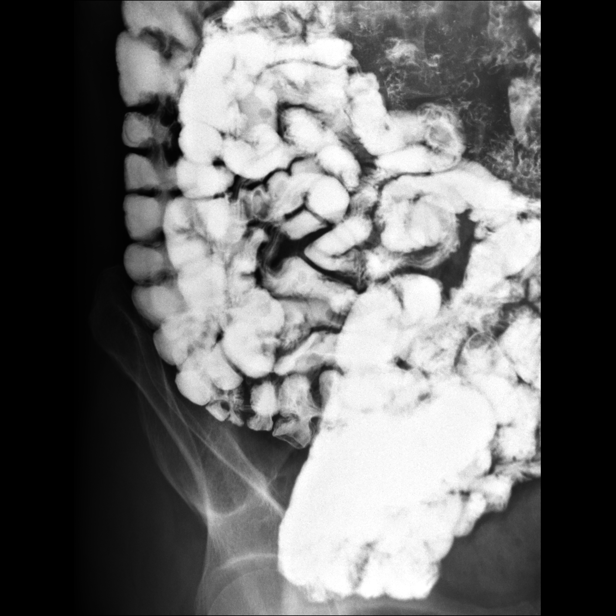
[im 11/16]
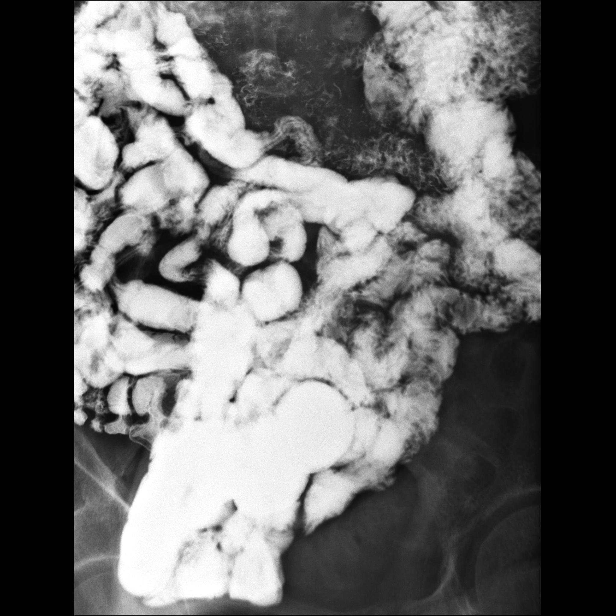
[im 14/16]
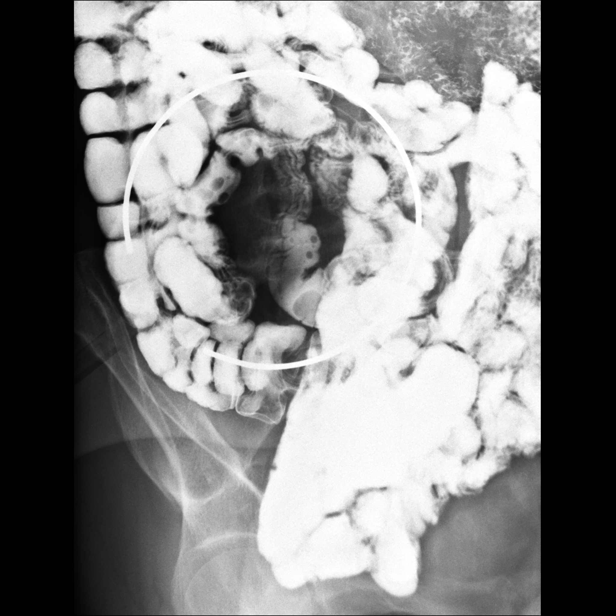
[im 16/16]
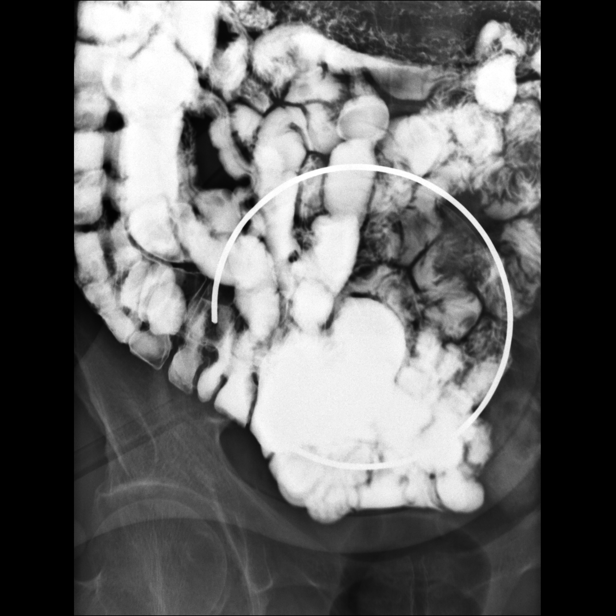

[14 of 24 positions shown; findings below may reference images not displayed]

FINDINGS: Preliminary KUB is normal.

Esophageal mucosa and motility normal. No stricture or mass.
Negative for hiatal hernia. Negative for gastroesophageal reflux.

Gastric mucosa normal. Negative for ulcer or mass. Duodenal bulb
normal.

Small bowel transit time is normal approximately 40 minutes. Small
bowel mucosa normal. No mucosal edema or mass. No small bowel
dilatation. No edema in the region of the terminal ileum. Terminal
ileum difficult to separate from adjacent bowel loops.
IMPRESSION: Negative upper GI and small-bowel follow-through

## 2022-12-29 ENCOUNTER — Other Ambulatory Visit: Payer: Self-pay

## 2022-12-29 DIAGNOSIS — F17200 Nicotine dependence, unspecified, uncomplicated: Secondary | ICD-10-CM

## 2022-12-29 DIAGNOSIS — Z122 Encounter for screening for malignant neoplasm of respiratory organs: Secondary | ICD-10-CM

## 2023-01-25 ENCOUNTER — Ambulatory Visit
Admission: RE | Admit: 2023-01-25 | Discharge: 2023-01-25 | Disposition: A | Discharge status: Home / Self Care | Payer: Medicare Other | Source: Ambulatory Visit | Attending: Physician Assistant | Admitting: Physician Assistant

## 2023-01-25 DIAGNOSIS — Z122 Encounter for screening for malignant neoplasm of respiratory organs: Secondary | ICD-10-CM

## 2023-01-25 DIAGNOSIS — F17200 Nicotine dependence, unspecified, uncomplicated: Secondary | ICD-10-CM

## 2023-02-18 ENCOUNTER — Ambulatory Visit
Admission: RE | Admit: 2023-02-18 | Discharge: 2023-02-18 | Disposition: A | Discharge status: Home / Self Care | Payer: Medicare Other | Source: Ambulatory Visit | Attending: Gastroenterology | Admitting: Gastroenterology

## 2023-02-18 ENCOUNTER — Other Ambulatory Visit: Payer: Self-pay | Admitting: Gastroenterology

## 2023-02-18 DIAGNOSIS — K5909 Other constipation: Secondary | ICD-10-CM

## 2024-05-12 ENCOUNTER — Other Ambulatory Visit (HOSPITAL_COMMUNITY): Payer: Self-pay | Admitting: *Deleted

## 2024-05-12 DIAGNOSIS — R1310 Dysphagia, unspecified: Secondary | ICD-10-CM

## 2024-05-26 ENCOUNTER — Ambulatory Visit (HOSPITAL_COMMUNITY)
Admission: RE | Admit: 2024-05-26 | Discharge: 2024-05-26 | Disposition: A | Discharge status: Home / Self Care | Source: Ambulatory Visit | Attending: Physician Assistant | Admitting: Physician Assistant

## 2024-05-26 DIAGNOSIS — R1310 Dysphagia, unspecified: Secondary | ICD-10-CM | POA: Insufficient documentation

## 2024-05-31 NOTE — Progress Notes (Signed)
 Sean Gordon MRN [id number]  Testing completed Thursday, May 22, 2024  05/31/24 1100  SLP Visit Information  SLP Received On 05/26/24  General Information  HPI pt is a 68 yo male referred by Dr Veronda Goody for OP MBS. 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  Pt underwent endscopy 2024/4 but symptoms had resolved by that time.  Finding of endoscopy showed lower esophageal ring, gastritis, LA grade B esophagitis.  He also had negative H Pylori.  Pt states foods cause him to choke and make him stop eating.  He also states that he senses liquids come up in the esophagus and cause him to choke on occasion- pointing distally.  Pt denies requiring heimlich manuever and states he does not expectorate food.  He stops eating when choking occurs and this contributes to weight loss.  Dysphagia has been ongoing for approx one year. Pt has previously consumed Ensures and ate food but reports he continues to lose weight.  He is down to 130 from 150-160.  Caregiver present No  Diet Prior to this Study Regular;Thin liquids (Level 0)  Temperature  Normal  Respiratory Status WFL  Supplemental O2 None (Room air)  History of Recent Intubation No  Behavior/Cognition Alert;Cooperative  Self-Feeding Abilities Able to self-feed  Baseline vocal quality/speech Normal  Volitional Cough Able to elicit  Volitional Cough Assessment Appears WFL  Volitional Swallow Able to elicit  Anatomy [facility]  Orofacial Exam  Oral Cavity: Oral Hygiene Erythema  Oral Cavity - Dentition Dentures, bottom;Dentures, top  Orofacial Anatomy WFL  Oral Motor/Sensory Function WFL  Boluses Administered  Boluses Administered Thin liquids (Level 0);Mildly thick liquids (Level 2, nectar thick);Puree;Solid  Oral Impairment Domain  Lip Closure No labial escape  Tongue control during bolus hold  Cohesive bolus between tongue to palatal seal  Bolus preparation/mastication Timely and efficient chewing and mashing  Bolus transport/lingual motion Brisk tongue motion  Oral residue Trace residue lining oral structures  Location of oral residue  Tongue  Initiation of pharyngeal swallow  Valleculae;Pyriform sinuses;Posterior laryngeal surface of the epiglottis  Pharyngeal Impairment Domain  Soft palate elevation No bolus between soft palate (SP)/pharyngeal wall (PW)  Laryngeal elevation Complete superior movement of thyroid cartilage with complete approximation of arytenoids to epiglottic petiole  Anterior hyoid excursion Complete anterior movement  Epiglottic movement Complete inversion  Laryngeal vestibule closure Incomplete, narrow column air/contrast in laryngeal vestibule  Pharyngeal stripping wave  Present - complete  Pharyngoesophageal segment opening Partial distention/partial duration, partial obstruction of flow  Tongue base retraction Trace column of contrast or air between tongue base and PPW  Pharyngeal residue Trace residue within or on pharyngeal structures  Location of pharyngeal residue Tongue base;Valleculae;Aryepiglottic folds;Pyriform sinuses  Esophageal Impairment Domain  Esophageal clearance upright position  (appearance of minimal esophageal retention distally - during testing, pt reported sensation of retention in esophagus - not the pharynx, barium tablet was not observed in esophagus upn sweep)  Pill  Consistency administered Thin liquids (Level 0)  Penetration/Aspiration Scale Score  1.  Material does not enter airway Mildly thick liquids (Level 2, nectar thick);Puree;Solid;Pill  2.  Material enters airway, remains ABOVE vocal cords then ejected out Thin liquids (Level 0)  Compensatory Strategies  Compensatory strategies No  Clinical Impression  Clinical Impression Functional oropharyngeal swallow ability noted without aspiration nor significant retention. Pt  with trace upper laryngeal  penetration of thin x2 of 6 boluses due to timing of laryngeal closure. No aspiration observed and there was not significant retention. Trace retention of liquids more than solids in oropharynx.  Barium tablet given with thin easily transited through oropharynx.  During MBS, pt reported sensation of retention in esophagus.  Recommend consider dedicated esophageal evaluation given pt's symptoms.  Thanks for this referral.  SLP Visit Diagnosis Dysphagia, unspecified (R13.10)  Exam Limitations No limitations  Swallowing Evaluation Recommendations  Recommendations PO diet  PO Diet Recommendation Regular;Thin liquids (Level 0)  Liquid Administration via Straw;Cup  Medication Administration Whole meds with liquid  Supervision Patient able to self-feed  Postural changes Stay upright 30-60 min after meals  Oral care recommendations Oral care BID (2x/day)  Recommended consults Consider esophageal assessment  SLP Time Calculation  SLP Start Time (ACUTE ONLY) 1310  SLP Stop Time (ACUTE ONLY) 1335  SLP Time Calculation (min) (ACUTE ONLY) 25 min   Testing completed Thursday, May 22, 2024 Maudie Sorrow, MS [facility] SLP Acute [employer] [phone]
